# Patient Record
Sex: Female | Born: 2000 | Race: Asian | Hispanic: No | Smoking: Former smoker
Health system: Southern US, Community
[De-identification: ages and names within clinical notes are randomized; demographics above are authoritative.]

## PROBLEM LIST (undated history)

## (undated) DIAGNOSIS — L309 Dermatitis, unspecified: Secondary | ICD-10-CM

## (undated) DIAGNOSIS — J45909 Unspecified asthma, uncomplicated: Secondary | ICD-10-CM

## (undated) HISTORY — DX: Dermatitis, unspecified: L30.9

## (undated) HISTORY — PX: WISDOM TOOTH EXTRACTION: SHX21

---
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
## 2022-04-20 ENCOUNTER — Other Ambulatory Visit: Payer: Self-pay

## 2022-04-20 ENCOUNTER — Emergency Department
Admission: EM | Admit: 2022-04-20 | Discharge: 2022-04-20 | Disposition: A | Discharge status: Home / Self Care | Payer: Medicaid Other | Attending: Emergency Medicine | Admitting: Emergency Medicine

## 2022-04-20 DIAGNOSIS — N3090 Cystitis, unspecified without hematuria: Secondary | ICD-10-CM | POA: Insufficient documentation

## 2022-04-20 DIAGNOSIS — J45909 Unspecified asthma, uncomplicated: Secondary | ICD-10-CM | POA: Diagnosis not present

## 2022-04-20 DIAGNOSIS — D72829 Elevated white blood cell count, unspecified: Secondary | ICD-10-CM | POA: Diagnosis not present

## 2022-04-20 DIAGNOSIS — R350 Frequency of micturition: Secondary | ICD-10-CM | POA: Diagnosis present

## 2022-04-20 LAB — URINALYSIS, ROUTINE W REFLEX MICROSCOPIC
Bilirubin Urine: NEGATIVE
Glucose, UA: NEGATIVE mg/dL
Ketones, ur: NEGATIVE mg/dL
Nitrite: NEGATIVE
Protein, ur: 100 mg/dL — AB
RBC / HPF: >50 RBC/hpf — ABNORMAL HIGH (ref 0–5)
Specific Gravity, Urine: 1.024 (ref 1.005–1.030)
Squamous Epithelial / HPF: NONE SEEN (ref 0–5)
WBC, UA: >50 WBC/hpf — ABNORMAL HIGH (ref 0–5)
pH: 7 (ref 5.0–8.0)

## 2022-04-20 LAB — POC URINE PREG, ED: Preg Test, Ur: NEGATIVE

## 2022-04-20 MED ORDER — CEPHALEXIN 500 MG PO CAPS: 500.0000 mg | ORAL_CAPSULE | Freq: Two times a day (BID) | ORAL | 0 refills | Status: AC

## 2022-04-20 NOTE — ED Provider Notes (Signed)
[facility] Provider Note    Event Date/Time   First MD Initiated Contact with Patient 04/20/22 1738     (approximate)   History   Chief Complaint Urinary Frequency   HPI Priscilla Cummings is a 21 y.o. female, history of asthma, eczema, presents to the emergency department for evaluation of dysuria.  Reports urinary frequency and dysuria for the past 2 days.  No known fevers at home.  She has been taking boric acid and cranberry supplements with little to no relief.  She states that she has had urinary tract infections in the past and this feels the same.  She is sexually active, states that she is not concerned for any STDs at this time.  Denies fever/chills, vaginal bleeding/discharge, abdominal pain, pelvic pain, diarrhea, nausea/vomiting, rash/lesions, or numbness/tingling upper or lower extremities.  History Limitations: No limitations.        Physical Exam  Triage Vital Signs: ED Triage Vitals  Enc Vitals Group     BP 04/20/22 1726 107/84     Pulse Rate 04/20/22 1726 (!) 109     Resp 04/20/22 1726 17     Temp 04/20/22 1726 98.7 F (37.1 C)     Temp Source 04/20/22 1726 Oral     SpO2 04/20/22 1726 96 %     Weight --      Height 04/20/22 1727 5\' 3"  (1.6 m)     Head Circumference --      Peak Flow --      Pain Score 04/20/22 1727 7     Pain Loc --      Pain Edu? --      Excl. in GC? --     Most recent vital signs: Vitals:   04/20/22 1726  BP: 107/84  Pulse: (!) 109  Resp: 17  Temp: 98.7 F (37.1 C)  SpO2: 96%    General: Awake, NAD.  Skin: Warm, dry. No rashes or lesions.  Eyes: PERRL. Conjunctivae normal.  CV: Good peripheral perfusion.  Resp: Normal effort.  Abd: Soft, non-tender. No distention.  Neuro: At baseline. No gross neurological deficits.   Focused Exam: No CVA tenderness.  Physical Exam    ED Results / Procedures / Treatments  Labs (all labs ordered are listed, but only abnormal results are displayed) Labs  Reviewed  URINALYSIS, ROUTINE W REFLEX MICROSCOPIC - Abnormal; Notable for the following components:      Result Value   Color, Urine YELLOW (*)    APPearance CLOUDY (*)    Hgb urine dipstick LARGE (*)    Protein, ur 100 (*)    Leukocytes,Ua LARGE (*)    RBC / HPF >50 (*)    WBC, UA >50 (*)    Bacteria, UA RARE (*)    Non Squamous Epithelial PRESENT (*)    All other components within normal limits  POC URINE PREG, ED     EKG N/A.   RADIOLOGY  ED Provider Interpretation: N/A.  No results found.  PROCEDURES:  Critical Care performed: N/A  Procedures    MEDICATIONS ORDERED IN ED: Medications - No data to display   IMPRESSION / MDM / ASSESSMENT AND PLAN / ED COURSE  I reviewed the triage vital signs and the nursing notes.                              Differential diagnosis includes, but is not limited to, cystitis, pyelonephritis,  urethritis.  ED Course Patient appears well, NAD.  Pregnancy test negative.  Urinalysis shows large leukocytes, greater than 50 WBC, rare bacteria.  In the presence of dysuria, highly suggestive of urinary tract infection.  Assessment/Plan Presentation consistent with cystitis.  Supported by urinalysis.  Physical exam unremarkable.  No CVA tenderness.  Very unlikely pyelonephritis or nephrolithiasis given her presentation.  We will provide her with a prescription for cephalexin.  No further work-up or treatment indicated at this time.  We will plan to discharge.  Provided the patient with anticipatory guidance, return precautions, and educational material. Encouraged the patient to return to the emergency department at any time if they begin to experience any new or worsening symptoms. Patient expressed understanding and agreed with the plan.       FINAL CLINICAL IMPRESSION(S) / ED DIAGNOSES   Final diagnoses:  Cystitis     Rx / DC Orders   ED Discharge Orders          Ordered    cephALEXin (KEFLEX) 500 MG capsule  2  times daily        04/20/22 1833             Note:  This document was prepared using Dragon voice recognition software and may include unintentional dictation errors.   Varney Daily, [place] 04/20/22 Ovidio Kin    Chesley Noon, MD 04/20/22 [phone]

## 2022-04-20 NOTE — ED Triage Notes (Signed)
Patient to ER via POV with reports of dysuria/ urinary frequency x2 days. No known fevers at home. Has been taking boric acid/ cranberry supplements with no relief in symptoms. Denies vaginal discharge.

## 2022-04-20 NOTE — ED Notes (Signed)
See triage note  presents with some urinary freq and pain  sxs' started 2 days ago  denies any n/v/d or fever

## 2022-04-20 NOTE — Discharge Instructions (Addendum)
-  Take all of your antibiotics as prescribed.  -Follow-up with your primary care provider as needed.  -Return to the emergency department anytime if you begin to experience any new or worsening symptoms. 

## 2022-05-04 IMAGING — DX DG ANKLE COMPLETE 3+V*L*
3 series · 3 of 3 positions shown · non-contrast
Comparison: None.

CLINICAL DATA: Left ankle pain

EXAM:
LEFT ANKLE COMPLETE - 3+ VIEW

[ankle ap]
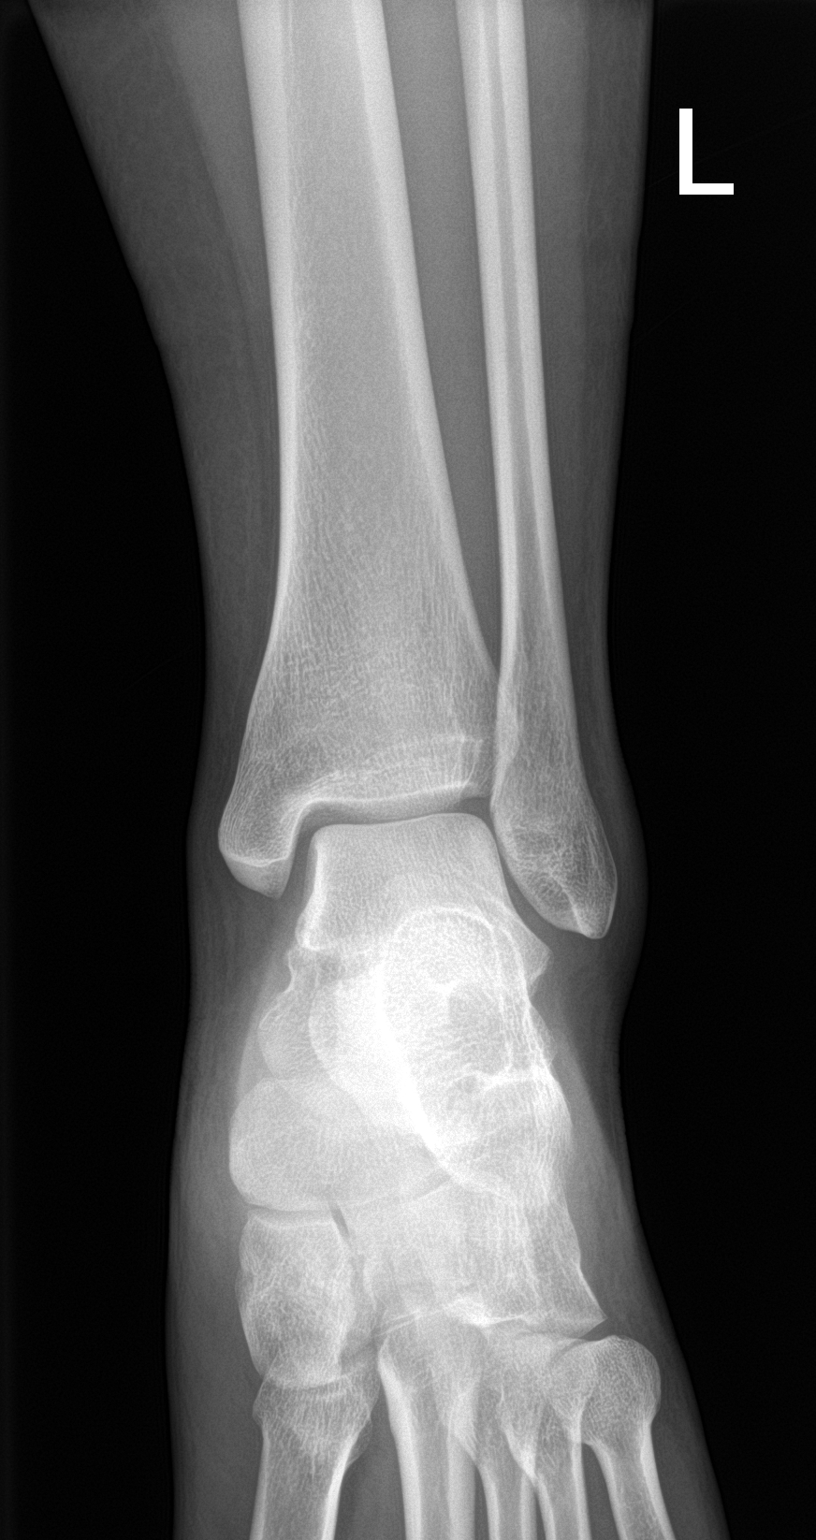

[ankle obl]
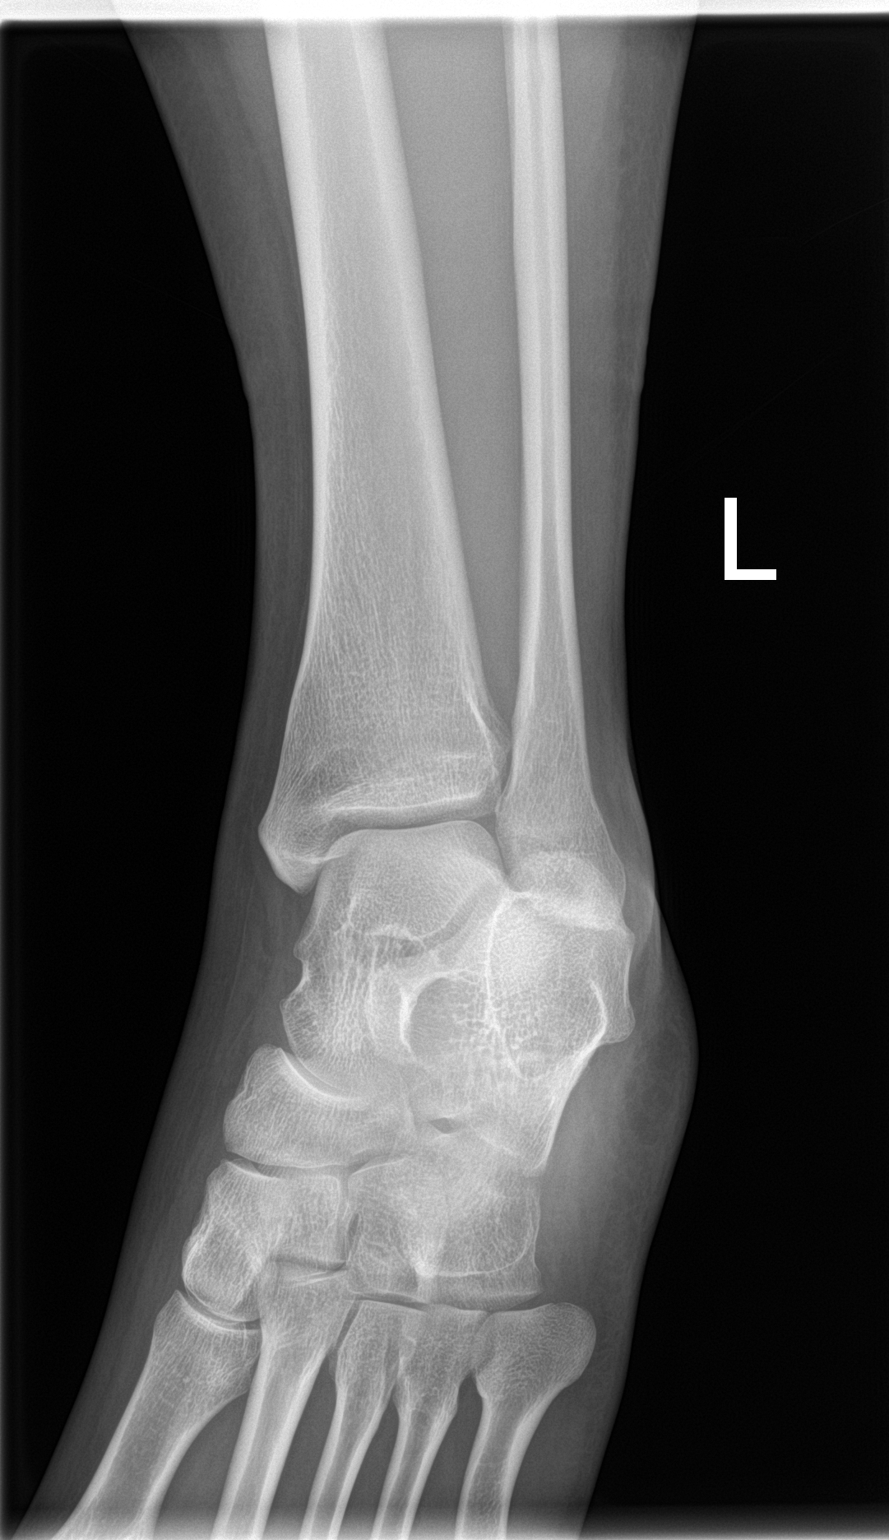

[ankle lat]
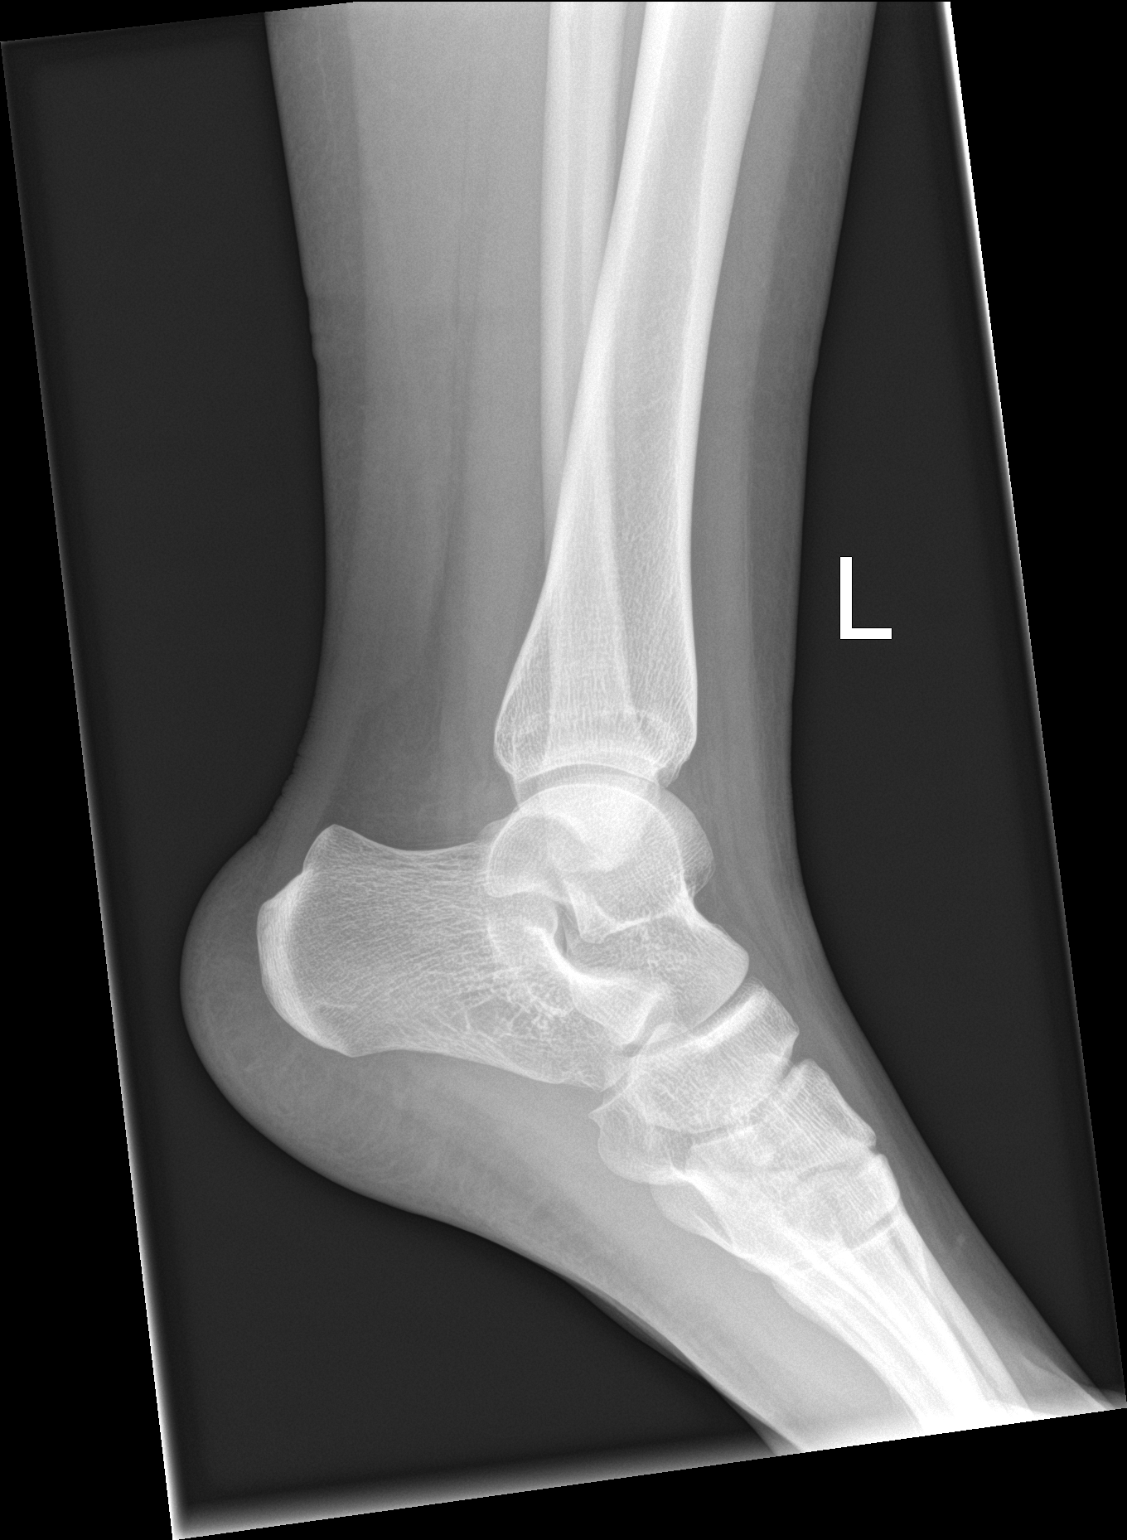

[3 of 3 positions shown; findings below may reference images not displayed]

FINDINGS: There is no evidence of fracture, dislocation, or joint effusion.
There is no evidence of arthropathy or other focal bone abnormality.
Soft tissues are unremarkable.
IMPRESSION: Negative.

## 2022-05-16 ENCOUNTER — Other Ambulatory Visit: Payer: Self-pay

## 2022-05-16 ENCOUNTER — Emergency Department
Admission: EM | Admit: 2022-05-16 | Discharge: 2022-05-16 | Disposition: A | Discharge status: Home / Self Care | Payer: Medicaid Other | Attending: Physician Assistant | Admitting: Physician Assistant

## 2022-05-16 ENCOUNTER — Emergency Department: Payer: Medicaid Other

## 2022-05-16 ENCOUNTER — Encounter: Payer: Self-pay | Admitting: Emergency Medicine

## 2022-05-16 DIAGNOSIS — J45909 Unspecified asthma, uncomplicated: Secondary | ICD-10-CM | POA: Diagnosis not present

## 2022-05-16 DIAGNOSIS — R059 Cough, unspecified: Secondary | ICD-10-CM | POA: Diagnosis present

## 2022-05-16 DIAGNOSIS — R051 Acute cough: Secondary | ICD-10-CM | POA: Insufficient documentation

## 2022-05-16 MED ORDER — PREDNISONE 10 MG (21) PO TBPK: ORAL_TABLET | ORAL | 0 refills | Status: DC

## 2022-05-16 MED ORDER — IPRATROPIUM-ALBUTEROL 0.5-2.5 (3) MG/3ML IN SOLN
3.0000 mL | Freq: Once | RESPIRATORY_TRACT | Status: AC
  Administered 2022-05-16: 3 mL via RESPIRATORY_TRACT
  Filled 2022-05-16: qty 3

## 2022-05-16 MED ORDER — BENZONATATE 100 MG PO CAPS: 100.0000 mg | ORAL_CAPSULE | Freq: Two times a day (BID) | ORAL | 0 refills | Status: AC | PRN

## 2022-05-16 NOTE — ED Provider Notes (Signed)
[facility] Provider Note  Patient Contact: 5:38 PM (approximate)   History   Cough   HPI  Priscilla Cummings is a 21 y.o. female history of asthma, presents to the emergency department with wheezing and cough for approximately 1 week.  Patient reports that she feels as though she is having an asthma exacerbation.  She states that she has been using her albuterol inhalers with little relief.  She describes a prodrome of viral URI-like symptoms before onset of cough.  No vomiting, diarrhea or recent fever.      Physical Exam   Triage Vital Signs: ED Triage Vitals  Enc Vitals Group     BP 05/16/22 1621 (!) 127/91     Pulse Rate 05/16/22 1621 66     Resp 05/16/22 1621 16     Temp 05/16/22 1621 98.8 F (37.1 C)     Temp Source 05/16/22 1621 Oral     SpO2 05/16/22 1621 99 %     Weight 05/16/22 1620 125 lb 10.6 oz (57 kg)     Height 05/16/22 1620 5\' 3"  (1.6 m)     Head Circumference --      Peak Flow --      Pain Score 05/16/22 1621 0     Pain Loc --      Pain Edu? --      Excl. in GC? --     Most recent vital signs: Vitals:   05/16/22 1621  BP: (!) 127/91  Pulse: 66  Resp: 16  Temp: 98.8 F (37.1 C)  SpO2: 99%     Constitutional: Alert and oriented. Patient is lying supine. Eyes: Conjunctivae are normal. PERRL. EOMI. Head: Atraumatic. ENT:      Ears: Tympanic membranes are mildly injected with mild effusion bilaterally.       Nose: No congestion/rhinnorhea.      Mouth/Throat: Mucous membranes are moist. Posterior pharynx is mildly erythematous.  Hematological/Lymphatic/Immunilogical: No cervical lymphadenopathy.  Cardiovascular: Normal rate, regular rhythm. Normal S1 and S2.  Good peripheral circulation. Respiratory: Normal respiratory effort without tachypnea or retractions.  Patient had wheezing auscultated bilaterally.  Good air entry to the bases with no decreased or absent breath sounds. Gastrointestinal: Bowel sounds 4 quadrants.  Soft and nontender to palpation. No guarding or rigidity. No palpable masses. No distention. No CVA tenderness. Musculoskeletal: Full range of motion to all extremities. No gross deformities appreciated. Neurologic:  Normal speech and language. No gross focal neurologic deficits are appreciated.  Skin:  Skin is warm, dry and intact. No rash noted. Psychiatric: Mood and affect are normal. Speech and behavior are normal. Patient exhibits appropriate insight and judgement.   ED Results / Procedures / Treatments   Labs (all labs ordered are listed, but only abnormal results are displayed) Labs Reviewed - No data to display    RADIOLOGY  I personally viewed and evaluated these images as part of my medical decision making, as well as reviewing the written report by the radiologist.  ED Provider Interpretation: No consolidations, opacities or infiltrates to suggest pneumonia.   PROCEDURES:  Critical Care performed: No  Procedures   MEDICATIONS ORDERED IN ED: Medications  ipratropium-albuterol (DUONEB) 0.5-2.5 (3) MG/3ML nebulizer solution 3 mL (3 mLs Nebulization Given 05/16/22 1745)     IMPRESSION / MDM / ASSESSMENT AND PLAN / ED COURSE  I reviewed the triage vital signs and the nursing notes.  Assessment and plan:  Cough:  20 year old female presents to the emergency department with cough for 1 week.  Vital signs were reassuring at triage.  On exam, patient was alert, active and nontoxic-appearing.  Patient was given DuoNeb.   Wheezing improved after DuoNeb.  Patient was discharged with tapered prednisone and Tessalon Perles.  Return precautions were given to return with new or worsening symptoms.  FINAL CLINICAL IMPRESSION(S) / ED DIAGNOSES   Final diagnoses:  Acute cough     Rx / DC Orders   ED Discharge Orders          Ordered    predniSONE (STERAPRED UNI-PAK 21 TAB) 10 MG (21) TBPK tablet        05/16/22 1805    benzonatate  (TESSALON PERLES) 100 MG capsule  2 times daily PRN        05/16/22 1805             Note:  This document was prepared using Dragon voice recognition software and may include unintentional dictation errors.   Pia Mau [place], PA-C 05/16/22 1810    Minna Antis, MD 05/16/22 [phone]

## 2022-05-16 NOTE — ED Provider Triage Note (Signed)
Emergency Medicine Provider Triage Evaluation Note  Priscilla Cummings , a 21 y.o. female  was evaluated in triage.  Pt complains of cough and congestion for 1 to 2 weeks.  States it started 2 days after she started female school.  Had fever and chills for stable.  None since then.  States mucus is stuck in her throat.  Having trouble breathing and inhaler is not helping..  Review of Systems  Positive: See above Negative: Chest pain  Physical Exam  BP (!) 127/91 (BP Location: Left Arm)   Pulse 66   Temp 98.8 F (37.1 C) (Oral)   Resp 16   Ht 5\' 3"  (1.6 m)   Wt 57 kg   SpO2 99%   BMI 22.26 kg/m  Gen:   Awake, no distress   Resp:  Normal effort  MSK:   Moves extremities without difficulty  Other:    Medical Decision Making  Medically screening exam initiated at 5:00 PM.  Appropriate orders placed.  JADWIGA FAIDLEY was informed that the remainder of the evaluation will be completed by another provider, this initial triage assessment does not replace that evaluation, and the importance of remaining in the ED until their evaluation is complete.  Patient appears well, lungs clear to auscultation.  Will order chest x-ray and then discharge   Ginger Organ, PA-C 05/16/22 1700

## 2022-05-16 NOTE — Discharge Instructions (Addendum)
Take tapered prednisone as directed. Take Tessalon Perles at night before bed.

## 2022-05-16 NOTE — ED Triage Notes (Signed)
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 Since being there, she has developed a cough. No relief with asthma inhaler or OTC meds.

## 2023-05-22 ENCOUNTER — Other Ambulatory Visit: Payer: Self-pay

## 2023-05-22 ENCOUNTER — Emergency Department
Admission: EM | Admit: 2023-05-22 | Discharge: 2023-05-22 | Disposition: A | Discharge status: Home / Self Care | Payer: Medicaid Other | Attending: Emergency Medicine | Admitting: Emergency Medicine

## 2023-05-22 DIAGNOSIS — R350 Frequency of micturition: Secondary | ICD-10-CM | POA: Insufficient documentation

## 2023-05-22 DIAGNOSIS — R300 Dysuria: Secondary | ICD-10-CM | POA: Diagnosis present

## 2023-05-22 DIAGNOSIS — N3000 Acute cystitis without hematuria: Secondary | ICD-10-CM

## 2023-05-22 LAB — URINALYSIS, ROUTINE W REFLEX MICROSCOPIC
Bilirubin Urine: NEGATIVE
Glucose, UA: NEGATIVE mg/dL
Ketones, ur: NEGATIVE mg/dL
Nitrite: NEGATIVE
Protein, ur: NEGATIVE mg/dL
Specific Gravity, Urine: 1.016 (ref 1.005–1.030)
WBC, UA: >50 WBC/hpf (ref 0–5)
pH: 5 (ref 5.0–8.0)

## 2023-05-22 MED ORDER — CEPHALEXIN 500 MG PO CAPS: 500.0000 mg | ORAL_CAPSULE | Freq: Four times a day (QID) | ORAL | 0 refills | Status: AC

## 2023-05-22 MED ORDER — CEPHALEXIN 500 MG PO CAPS
500.0000 mg | ORAL_CAPSULE | Freq: Once | ORAL | Status: AC
  Administered 2023-05-22: 500 mg via ORAL
  Filled 2023-05-22: qty 1

## 2023-05-22 NOTE — ED Notes (Signed)
See triage notes. Patient has a history of UTIs. C/O increased frequency and dysuria

## 2023-05-22 NOTE — ED Provider Notes (Signed)
[facility] Provider Note  Patient Contact: 4:56 PM (approximate)   History   UTI   HPI  RINOA GARRAMONE is a 22 y.o. female presents to the emergency department with dysuria and increased urinary frequency for the past 2 to 3 days.  Patient denies fever and chills.  She states that she has had some nausea but no vomiting.  No flank pain.  She denies possibility of pregnancy.  No chest pain, chest tightness or abdominal pain.  No concern for STDs or vaginal rash.      Physical Exam   Triage Vital Signs: ED Triage Vitals [05/22/23 1557]  Enc Vitals Group     BP 117/81     Pulse Rate 67     Resp 18     Temp 98.1 F (36.7 C)     Temp Source Oral     SpO2 100 %     Weight      Height      Head Circumference      Peak Flow      Pain Score      Pain Loc      Pain Edu?      Excl. in GC?     Most recent vital signs: Vitals:   05/22/23 1557  BP: 117/81  Pulse: 67  Resp: 18  Temp: 98.1 F (36.7 C)  SpO2: 100%     General: Alert and in no acute distress. Eyes:  PERRL. EOMI. Head: No acute traumatic findings ENT:      Nose: No congestion/rhinnorhea.      Mouth/Throat: Mucous membranes are moist. Neck: No stridor. No cervical spine tenderness to palpation. Cardiovascular:  Good peripheral perfusion Respiratory: Normal respiratory effort without tachypnea or retractions. Lungs CTAB. Good air entry to the bases with no decreased or absent breath sounds. Gastrointestinal: Bowel sounds 4 quadrants. Soft and nontender to palpation. No guarding or rigidity. No palpable masses. No distention. No CVA tenderness. Musculoskeletal: Full range of motion to all extremities.  Neurologic:  No gross focal neurologic deficits are appreciated.  Skin:   No rash noted    ED Results / Procedures / Treatments   Labs (all labs ordered are listed, but only abnormal results are displayed) Labs Reviewed  URINALYSIS, ROUTINE W REFLEX MICROSCOPIC - Abnormal;  Notable for the following components:      Result Value   Color, Urine YELLOW (*)    APPearance HAZY (*)    Hgb urine dipstick MODERATE (*)    Leukocytes,Ua LARGE (*)    Bacteria, UA RARE (*)    All other components within normal limits  URINE CULTURE       PROCEDURES:  Critical Care performed: No  Procedures   MEDICATIONS ORDERED IN ED: Medications  cephALEXin (KEFLEX) capsule 500 mg (500 mg Oral Given 05/22/23 1658)     IMPRESSION / MDM / ASSESSMENT AND PLAN / ED COURSE  I reviewed the triage vital signs and the nursing notes.                              Assessment and plan UTI 22 year old female presents to the emergency department with dysuria and increased urinary frequency for the past 2 to 3 days without flank pain, fever or vomiting to suggest pyelonephritis or nephrolithiasis.  Urine culture is in process and will treat with Keflex 4 times daily for the next 7 days.  Patient's first  dose of Keflex was given while in the emergency department.      FINAL CLINICAL IMPRESSION(S) / ED DIAGNOSES   Final diagnoses:  Dysuria     Rx / DC Orders   ED Discharge Orders     None        Note:  This document was prepared using Dragon voice recognition software and may include unintentional dictation errors.   Pia Mau [place], PA-C 05/22/23 1801    Chesley Noon, MD 05/22/23 [phone]

## 2023-05-22 NOTE — Discharge Instructions (Signed)
Take Keflex four times daily for the next seven days.  

## 2023-05-22 NOTE — ED Triage Notes (Signed)
Pt presents to ED with c/o of UTI, pt states HX of same. Pt states increased urine frequency and dysuria. NAD noted.

## 2023-05-25 LAB — URINE CULTURE: Culture: >=100000 — AB

## 2023-06-14 ENCOUNTER — Ambulatory Visit: Payer: MEDICAID

## 2023-07-20 ENCOUNTER — Encounter: Payer: Self-pay | Admitting: Emergency Medicine

## 2023-07-20 ENCOUNTER — Emergency Department
Admission: EM | Admit: 2023-07-20 | Discharge: 2023-07-20 | Disposition: A | Discharge status: Home / Self Care | Payer: MEDICAID | Attending: Emergency Medicine | Admitting: Emergency Medicine

## 2023-07-20 ENCOUNTER — Other Ambulatory Visit: Payer: Self-pay

## 2023-07-20 DIAGNOSIS — J45909 Unspecified asthma, uncomplicated: Secondary | ICD-10-CM | POA: Insufficient documentation

## 2023-07-20 DIAGNOSIS — R300 Dysuria: Secondary | ICD-10-CM | POA: Diagnosis present

## 2023-07-20 DIAGNOSIS — N390 Urinary tract infection, site not specified: Secondary | ICD-10-CM | POA: Diagnosis not present

## 2023-07-20 DIAGNOSIS — D72829 Elevated white blood cell count, unspecified: Secondary | ICD-10-CM | POA: Insufficient documentation

## 2023-07-20 LAB — URINALYSIS, ROUTINE W REFLEX MICROSCOPIC
Bilirubin Urine: NEGATIVE
Glucose, UA: NEGATIVE mg/dL
Ketones, ur: 5 mg/dL — AB
Nitrite: NEGATIVE
Protein, ur: >=300 mg/dL — AB
RBC / HPF: >50 RBC/hpf (ref 0–5)
Specific Gravity, Urine: 1.026 (ref 1.005–1.030)
WBC, UA: >50 WBC/hpf (ref 0–5)
pH: 6 (ref 5.0–8.0)

## 2023-07-20 LAB — PREGNANCY, URINE: Preg Test, Ur: NEGATIVE

## 2023-07-20 MED ORDER — CEPHALEXIN 500 MG PO CAPS: 500.0000 mg | ORAL_CAPSULE | Freq: Three times a day (TID) | ORAL | 0 refills | Status: AC

## 2023-07-20 MED ORDER — PHENAZOPYRIDINE HCL 100 MG PO TABS: 100.0000 mg | ORAL_TABLET | Freq: Three times a day (TID) | ORAL | 0 refills | Status: AC | PRN

## 2023-07-20 NOTE — ED Triage Notes (Signed)
Pt to ED via POV. Pt states that she had been having pain with urination for the past few days. Pt states that she has also had the urge to use the restroom a lot. Pt states that she is also having irritation. Pt is currently in NAD.

## 2023-07-20 NOTE — ED Provider Notes (Signed)
[facility] Provider Note    Event Date/Time   First MD Initiated Contact with Patient 07/20/23 [phone]     (approximate)   History   Dysuria   HPI  Priscilla Cummings is a 22 y.o. female with history of asthma that presents emergency department with dysuria.  History of similar symptoms in the past.  No vaginal discharge.  No concerns for STD.  No fever or chills.  No vomiting.      Physical Exam   Triage Vital Signs: ED Triage Vitals [07/20/23 0900]  Encounter Vitals Group     BP 124/87     Systolic BP Percentile      Diastolic BP Percentile      Pulse Rate (!) 116     Resp 16     Temp 97.8 F (36.6 C)     Temp Source Oral     SpO2 99 %     Weight 119 lb (54 kg)     Height 5\' 3"  (1.6 m)     Head Circumference      Peak Flow      Pain Score 0     Pain Loc      Pain Education      Exclude from Growth Chart     Most recent vital signs: Vitals:   07/20/23 0900  BP: 124/87  Pulse: (!) 116  Resp: 16  Temp: 97.8 F (36.6 C)  SpO2: 99%     General: Awake, no distress.   CV:  Good peripheral perfusion. regular rate and  rhythm Resp:  Normal effort.  Abd:  No distention.  Nontender, no CVA tenderness Other:     ED Results / Procedures / Treatments   Labs (all labs ordered are listed, but only abnormal results are displayed) Labs Reviewed  URINALYSIS, ROUTINE W REFLEX MICROSCOPIC - Abnormal; Notable for the following components:      Result Value   Color, Urine YELLOW (*)    APPearance CLOUDY (*)    Hgb urine dipstick SMALL (*)    Ketones, ur 5 (*)    Protein, ur >=300 (*)    Leukocytes,Ua LARGE (*)    Bacteria, UA RARE (*)    All other components within normal limits  PREGNANCY, URINE     EKG     RADIOLOGY     PROCEDURES:   Procedures   MEDICATIONS ORDERED IN ED: Medications - No data to display   IMPRESSION / MDM / ASSESSMENT AND PLAN / ED COURSE  I reviewed the triage vital signs and the nursing  notes.                              Differential diagnosis includes, but is not limited to, UTI, pyelonephritis, cystitis, STD  Patient's presentation is most consistent with acute complicated illness / injury requiring diagnostic workup.   UA and urine pregnancy ordered  Feel that STD is less likely as the patient has noes concerns due to being with the same partner for 3 years.  Also do not feel this is pyelonephritis secondary to afebrile, no vomiting etc.  UA has large amount of leuks, greater than 300 protein, rare bacteria white blood cell clumps noted  Urine pregnancy is negative  Will treat the patient with antibiotic.  Keflex 500mg , pyridium.  Patient is in agreement treatment plan.  Discharged stable condition.  Strict instructions to return for worsening.  FINAL CLINICAL IMPRESSION(S) / ED DIAGNOSES   Final diagnoses:  Dysuria  Acute UTI     Rx / DC Orders   ED Discharge Orders          Ordered    cephALEXin (KEFLEX) 500 MG capsule  3 times daily        07/20/23 0949    phenazopyridine (PYRIDIUM) 100 MG tablet  3 times daily PRN        07/20/23 0949             Note:  This document was prepared using Dragon voice recognition software and may include unintentional dictation errors.    Faythe Ghee, PA-C 07/20/23 8657    Chesley Noon, MD 07/20/23 [phone]

## 2023-08-05 ENCOUNTER — Emergency Department
Admission: EM | Admit: 2023-08-05 | Discharge: 2023-08-05 | Disposition: A | Discharge status: Home / Self Care | Payer: MEDICAID | Attending: Emergency Medicine | Admitting: Emergency Medicine

## 2023-08-05 ENCOUNTER — Other Ambulatory Visit: Payer: Self-pay

## 2023-08-05 ENCOUNTER — Encounter: Payer: Self-pay | Admitting: Emergency Medicine

## 2023-08-05 DIAGNOSIS — N760 Acute vaginitis: Secondary | ICD-10-CM | POA: Diagnosis not present

## 2023-08-05 DIAGNOSIS — N898 Other specified noninflammatory disorders of vagina: Secondary | ICD-10-CM | POA: Diagnosis present

## 2023-08-05 DIAGNOSIS — J45909 Unspecified asthma, uncomplicated: Secondary | ICD-10-CM | POA: Insufficient documentation

## 2023-08-05 DIAGNOSIS — B9689 Other specified bacterial agents as the cause of diseases classified elsewhere: Secondary | ICD-10-CM

## 2023-08-05 LAB — WET PREP, GENITAL
Sperm: NONE SEEN
Trich, Wet Prep: NONE SEEN
WBC, Wet Prep HPF POC: <10 (ref <10)
Yeast Wet Prep HPF POC: NONE SEEN

## 2023-08-05 LAB — URINALYSIS, ROUTINE W REFLEX MICROSCOPIC
Bilirubin Urine: NEGATIVE
Glucose, UA: NEGATIVE mg/dL
Ketones, ur: NEGATIVE mg/dL
Nitrite: NEGATIVE
Protein, ur: NEGATIVE mg/dL
RBC / HPF: >50 RBC/hpf (ref 0–5)
Specific Gravity, Urine: 1.011 (ref 1.005–1.030)
Squamous Epithelial / HPF: NONE SEEN /HPF (ref 0–5)
WBC, UA: >50 WBC/hpf (ref 0–5)
pH: 7 (ref 5.0–8.0)

## 2023-08-05 LAB — CHLAMYDIA/NGC RT PCR (ARMC ONLY)
Chlamydia Tr: NOT DETECTED
N gonorrhoeae: NOT DETECTED

## 2023-08-05 LAB — POC URINE PREG, ED: Preg Test, Ur: NEGATIVE

## 2023-08-05 LAB — HIV ANTIBODY (ROUTINE TESTING W REFLEX): HIV Screen 4th Generation wRfx: NONREACTIVE

## 2023-08-05 MED ORDER — METRONIDAZOLE 500 MG PO TABS
500.0000 mg | ORAL_TABLET | Freq: Two times a day (BID) | ORAL | 0 refills | Status: AC
Start: 2023-08-05 — End: 2023-08-12

## 2023-08-05 NOTE — Discharge Instructions (Addendum)
Your test is positive for Bacterial Vaginosis. Take the medications as prescribed. Gonorrhea and Chlamydia were negative. Return for new, worsening, or change in symptoms or other concerns.

## 2023-08-05 NOTE — ED Triage Notes (Addendum)
Pt via POV from home. Pt states that she had "too much fun" with her boyfriend when they had sexual intercourse on Wednesday. Since then she has had urinary frequency, pelvic pressure, pinkish vaginal discharge, and foul odor. She also wanted to be tested for STDs. Pt reports that she was here 2 weeks ago for same but complete abx course. Pt is A&Ox4 and NAD

## 2023-08-05 NOTE — ED Provider Notes (Signed)
[facility] Provider Note    Event Date/Time   First MD Initiated Contact with Patient 08/05/23 1112     (approximate)   History   Urinary Tract Infection   HPI  Priscilla Cummings is a 22 y.o. female who presents today for evaluation of burning with urination, and vaginal discharge that has been ongoing for the past 2 days.  She reports that she has been sexually active with her boyfriend who she has since learned has been unfaithful and has had intercourse with other women.  She reports that she has a faint smell to her vaginal discharge.  No abdominal pain.  No dyspareunia.  No fevers or chills.  Patient Active Problem List   Diagnosis Date Noted   Pyelonephritis dx'd 12/14/21 ER 12/23/2021   Anaphylactic shock 05/31/2021   Allergic reaction    Asthma 10/29/2020   AR (allergic rhinitis) 10/07/2012          Physical Exam   Triage Vital Signs: ED Triage Vitals  Encounter Vitals Group     BP 08/05/23 1053 111/88     Systolic BP Percentile --      Diastolic BP Percentile --      Pulse Rate 08/05/23 1053 (!) 102     Resp 08/05/23 1053 20     Temp 08/05/23 1053 98 F (36.7 C)     Temp src --      SpO2 08/05/23 1053 100 %     Weight 08/05/23 1055 120 lb (54.4 kg)     Height 08/05/23 1055 5\' 3"  (1.6 m)     Head Circumference --      Peak Flow --      Pain Score --      Pain Loc --      Pain Education --      Exclude from Growth Chart --     Most recent vital signs: Vitals:   08/05/23 1053  BP: 111/88  Pulse: (!) 102  Resp: 20  Temp: 98 F (36.7 C)  SpO2: 100%    Physical Exam Vitals and nursing note reviewed.  Constitutional:      General: Awake and alert. No acute distress.    Appearance: Normal appearance. The patient is normal weight.  HENT:     Head: Normocephalic and atraumatic.     Mouth: Mucous membranes are moist.  Eyes:     General: PERRL. Normal EOMs        Right eye: No discharge.        Left eye: No discharge.      Conjunctiva/sclera: Conjunctivae normal.  Cardiovascular:     Rate and Rhythm: Normal rate and regular rhythm.     Pulses: Normal pulses.  Pulmonary:     Effort: Pulmonary effort is normal. No respiratory distress.     Breath sounds: Normal breath sounds.  Abdominal:     Abdomen is soft. There is no abdominal tenderness. No rebound or guarding. No distention. No CVAT Musculoskeletal:        General: No swelling. Normal range of motion.     Cervical back: Normal range of motion and neck supple.  Skin:    General: Skin is warm and dry.     Capillary Refill: Capillary refill takes less than 2 seconds.     Findings: No rash.  Neurological:     Mental Status: The patient is awake and alert.      ED Results / Procedures / Treatments  Labs (all labs ordered are listed, but only abnormal results are displayed) Labs Reviewed  WET PREP, GENITAL - Abnormal; Notable for the following components:      Result Value   Clue Cells Wet Prep HPF POC PRESENT (*)    All other components within normal limits  URINALYSIS, ROUTINE W REFLEX MICROSCOPIC - Abnormal; Notable for the following components:   Color, Urine YELLOW (*)    APPearance CLOUDY (*)    Hgb urine dipstick MODERATE (*)    Leukocytes,Ua LARGE (*)    Bacteria, UA RARE (*)    All other components within normal limits  CHLAMYDIA/NGC RT PCR ([facility] ONLY)            URINE CULTURE  RPR  HIV ANTIBODY (ROUTINE TESTING W REFLEX)  POC URINE PREG, ED     EKG     RADIOLOGY     PROCEDURES:  Critical Care performed:   Procedures   MEDICATIONS ORDERED IN ED: Medications - No data to display   IMPRESSION / MDM / ASSESSMENT AND PLAN / ED COURSE  I reviewed the triage vital signs and the nursing notes.   Differential diagnosis includes, but is not limited to, sexually transmitted infection, bacterial vaginosis, UTI.  Patient is awake and alert, hemodynamically stable and afebrile.  She is nontoxic in appearance.  She has  no abdominal tenderness or flank pain/CVA tenderness.  No fevers or chills.  I do not suspect pyelonephritis, TOA, or PID at this time.  She has not had any dyspareunia.  Patient opted to self swab, declined pelvic exam.  Her first swab reveals clue cells consistent with bacterial vaginosis.  Urinalysis also demonstrates RBCs and WBCs, but rare bacteria, sent for culture as she reports that she has been treated for UTIs twice in the past 3 weeks. Started on flagyl.  Patient was advised that there are many other STDs that patient could have, that we do not test for in the emergency department. Patient was advised to follow up with a primary care doctor to have the full panel of testing performed. Patient was advised to not have sexual intercourse until fully and properly tested and treated. Patient was advised that his partner also needs to be tested and treated. Patient understands and agrees with plan.   Patient's presentation is most consistent with acute complicated illness / injury requiring diagnostic workup.    FINAL CLINICAL IMPRESSION(S) / ED DIAGNOSES   Final diagnoses:  BV (bacterial vaginosis)     Rx / DC Orders   ED Discharge Orders          Ordered    metroNIDAZOLE (FLAGYL) 500 MG tablet  2 times daily        08/05/23 1404             Note:  This document was prepared using Dragon voice recognition software and may include unintentional dictation errors.   Jackelyn Hoehn, PA-C 08/05/23 1416    Janith Lima, MD 08/05/23 [phone]

## 2023-08-06 LAB — RPR: RPR Ser Ql: NONREACTIVE

## 2023-08-06 NOTE — Group Note (Deleted)

## 2023-08-08 LAB — URINE CULTURE: Culture: 40000 — AB

## 2023-09-03 ENCOUNTER — Emergency Department
Admission: EM | Admit: 2023-09-03 | Discharge: 2023-09-03 | Disposition: A | Discharge status: Home / Self Care | Payer: MEDICAID | Attending: Emergency Medicine | Admitting: Emergency Medicine

## 2023-09-03 ENCOUNTER — Other Ambulatory Visit: Payer: Self-pay

## 2023-09-03 DIAGNOSIS — B9689 Other specified bacterial agents as the cause of diseases classified elsewhere: Secondary | ICD-10-CM

## 2023-09-03 DIAGNOSIS — N760 Acute vaginitis: Secondary | ICD-10-CM | POA: Insufficient documentation

## 2023-09-03 DIAGNOSIS — J45909 Unspecified asthma, uncomplicated: Secondary | ICD-10-CM | POA: Insufficient documentation

## 2023-09-03 DIAGNOSIS — N898 Other specified noninflammatory disorders of vagina: Secondary | ICD-10-CM | POA: Diagnosis present

## 2023-09-03 LAB — URINALYSIS, ROUTINE W REFLEX MICROSCOPIC
Bilirubin Urine: NEGATIVE
Glucose, UA: NEGATIVE mg/dL
Hgb urine dipstick: NEGATIVE
Ketones, ur: 5 mg/dL — AB
Nitrite: NEGATIVE
Protein, ur: 30 mg/dL — AB
Specific Gravity, Urine: 1.027 (ref 1.005–1.030)
pH: 5 (ref 5.0–8.0)

## 2023-09-03 LAB — WET PREP, GENITAL
Sperm: NONE SEEN
Trich, Wet Prep: NONE SEEN
WBC, Wet Prep HPF POC: >=10 — AB (ref <10)
Yeast Wet Prep HPF POC: NONE SEEN

## 2023-09-03 LAB — POC URINE PREG, ED: Preg Test, Ur: NEGATIVE

## 2023-09-03 MED ORDER — CLINDAMYCIN HCL 150 MG PO CAPS: 300.0000 mg | ORAL_CAPSULE | Freq: Three times a day (TID) | ORAL | 0 refills | Status: DC

## 2023-09-03 NOTE — ED Provider Notes (Signed)
[facility] Provider Note    Event Date/Time   First MD Initiated Contact with Patient 09/03/23 1137     (approximate)   History   Vaginal Discharge   HPI  Priscilla Cummings is a 22 y.o. female with history of asthma and eczema presents emergency department with vaginal discharge and odor.  She thinks he has bacterial vaginosis as it has a fishy odor.  States that she is not concerned about an STD.  She was tested for that recently.  Has not changed partners.  Denies fever or chills.  Some burning with intercourse.  But no pain.  No dysuria.  Patient states its time for her IUD to be removed and is wondering if that is creating problems      Physical Exam   Triage Vital Signs: ED Triage Vitals  Encounter Vitals Group     BP 09/03/23 1030 123/85     Systolic BP Percentile --      Diastolic BP Percentile --      Pulse Rate 09/03/23 1030 88     Resp 09/03/23 1030 18     Temp 09/03/23 1030 98.6 F (37 C)     Temp src --      SpO2 09/03/23 1030 100 %     Weight 09/03/23 1040 119 lb 0.8 oz (54 kg)     Height 09/03/23 1040 5\' 3"  (1.6 m)     Head Circumference --      Peak Flow --      Pain Score 09/03/23 1031 2     Pain Loc --      Pain Education --      Exclude from Growth Chart --     Most recent vital signs: Vitals:   09/03/23 1030  BP: 123/85  Pulse: 88  Resp: 18  Temp: 98.6 F (37 C)  SpO2: 100%     General: Awake, no distress.   CV:  Good peripheral perfusion. regular rate and  rhythm Resp:  Normal effort.  Abd:  No distention.   Other:  Pelvic exam deferred by patient, self swab initiated   ED Results / Procedures / Treatments   Labs (all labs ordered are listed, but only abnormal results are displayed) Labs Reviewed  WET PREP, GENITAL - Abnormal; Notable for the following components:      Result Value   Clue Cells Wet Prep HPF POC PRESENT (*)    WBC, Wet Prep HPF POC >=10 (*)    All other components within normal limits   URINALYSIS, ROUTINE W REFLEX MICROSCOPIC - Abnormal; Notable for the following components:   Color, Urine YELLOW (*)    APPearance CLOUDY (*)    Ketones, ur 5 (*)    Protein, ur 30 (*)    Leukocytes,Ua MODERATE (*)    Bacteria, UA RARE (*)    All other components within normal limits  POC URINE PREG, ED     EKG     RADIOLOGY     PROCEDURES:   Procedures   MEDICATIONS ORDERED IN ED: Medications - No data to display   IMPRESSION / MDM / ASSESSMENT AND PLAN / ED COURSE  I reviewed the triage vital signs and the nursing notes.                              Differential diagnosis includes, but is not limited to, vaginosis, candidiasis, vaginitis  Patient's  presentation is most consistent with acute complicated illness / injury requiring diagnostic workup.   Patient's POC pregnancy negative, UA shows moderate leuks, wet prep shows clue cells and WBCs  Patient's diagnosis will be bacterial vaginosis.  Since she failed on the Flagyl we will start her on clindamycin.  She is to follow-up with the GYN doctor.  Return emergency department worsening.  She is in agreement treatment plan.  Discharged stable condition.      FINAL CLINICAL IMPRESSION(S) / ED DIAGNOSES   Final diagnoses:  BV (bacterial vaginosis)     Rx / DC Orders   ED Discharge Orders          Ordered    clindamycin (CLEOCIN) 150 MG capsule  3 times daily        09/03/23 1235             Note:  This document was prepared using Dragon voice recognition software and may include unintentional dictation errors.    Faythe Ghee, PA-C 09/03/23 1236    Merwyn Katos, MD 09/04/23 [phone]

## 2023-09-03 NOTE — ED Triage Notes (Signed)
Pt to ED for vaginal discharge with odor. States thinks has BV. States would have came sooner over the weekend but wanted to come on business day when there would be more nurses to help.

## 2023-09-03 NOTE — Discharge Instructions (Signed)
Take a daily probiotic to help with the vaginal flora balance. Take medication as prescribed Follow-up with your doctor for concerns about your IUD

## 2023-09-26 ENCOUNTER — Ambulatory Visit: Payer: MEDICAID

## 2023-10-01 ENCOUNTER — Encounter: Payer: Self-pay | Admitting: Family Medicine

## 2023-10-01 ENCOUNTER — Ambulatory Visit: Payer: MEDICAID | Admitting: Family Medicine

## 2023-10-01 VITALS — BP 109/70 | HR 72 | Ht 63.0 in | Wt 121.0 lb

## 2023-10-01 DIAGNOSIS — Z3009 Encounter for other general counseling and advice on contraception: Secondary | ICD-10-CM

## 2023-10-01 DIAGNOSIS — Z01419 Encounter for gynecological examination (general) (routine) without abnormal findings: Secondary | ICD-10-CM | POA: Diagnosis not present

## 2023-10-01 DIAGNOSIS — Z113 Encounter for screening for infections with a predominantly sexual mode of transmission: Secondary | ICD-10-CM

## 2023-10-01 LAB — HM HIV SCREENING LAB: HM HIV Screening: NEGATIVE

## 2023-10-01 LAB — WET PREP FOR TRICH, YEAST, CLUE
Trichomonas Exam: NEGATIVE
Yeast Exam: NEGATIVE

## 2023-10-01 NOTE — Progress Notes (Signed)
[facility] DEPARTMENT [facility] [address] Main Number: [phone]  Family Planning Visit- Repeat Yearly Visit  Subjective:  Priscilla Cummings is a 22 y.o. G0P0000  being seen today for an annual wellness visit and to discuss contraception options.   The patient is currently using IUD or IUS for pregnancy prevention. Patient does not know want a pregnancy in the next year.    report they are looking for a method that provides High efficacy at preventing pregnancy   Patient has the following medical problems: has AR (allergic rhinitis); Asthma; Anaphylactic shock; Allergic reaction; and Pyelonephritis dx'd 12/14/21 ER on their problem list.  Chief Complaint  Patient presents with   Annual Exam    Patient reports to clinic for IUD removal. Reports that since having the IUD placed- she has had more UTIs. I reviewed basic anatomy and why I did not believe the IUD was causing UTI. Pt additionally reports she might not want to be pregnant in the next year. She reports that she thought her fertility would be delayed. I reviewed that patient's generally return back to baseline right after IUD removal, and I would not recommend removal if she didn't want to get pregnant now.   Patient denies concerns about self   See flowsheet for other program required questions.   Body mass index is 21.43 kg/m. - Patient is eligible for diabetes screening based on BMI> 25 and age >35?  no HA1C ordered? not applicable  Patient reports 1 of partners in last year. Desires STI screening?  Yes   Has patient been screened once for HCV in the past?  No  No results found for: "HCVAB"  Does the patient have current of drug use, have a partner with drug use, and/or has been incarcerated since last result? No  If yes-- Screen for HCV through [facility]   Does the patient meet criteria for HBV testing? No  Criteria:  -Household, sexual or needle sharing contact with  HBV -History of drug use -HIV positive -Those with known Hep C   Health Maintenance Due  Topic Date Due   Hepatitis C Screening  Never done   INFLUENZA VACCINE  06/28/2023   COVID-19 Vaccine (4 - 2023-24 season) 07/29/2023    Review of Systems  Constitutional:  Negative for weight loss.  Eyes:  Negative for blurred vision.  Respiratory:  Negative for cough and shortness of breath.   Cardiovascular:  Negative for claudication.  Gastrointestinal:  Negative for nausea.  Genitourinary:  Negative for dysuria and frequency.  Skin:  Negative for rash.  Neurological:  Negative for headaches.  Endo/Heme/Allergies:  Does not bruise/bleed easily.    The following portions of the patient's history were reviewed and updated as appropriate: allergies, current medications, past family history, past medical history, past social history, past surgical history and problem list. Problem list updated.  Objective:   Vitals:   10/01/23 0837  BP: 109/70  Pulse: 72  Weight: 121 lb (54.9 kg)  Height: 5\' 3"  (1.6 m)    Physical Exam Vitals and nursing note reviewed. Exam conducted with a chaperone present (declined chaperone).  Constitutional:      Appearance: Normal appearance.  HENT:     Head: Normocephalic and atraumatic.     Mouth/Throat:     Mouth: Mucous membranes are moist.     Pharynx: Oropharynx is clear. No oropharyngeal exudate or posterior oropharyngeal erythema.  Pulmonary:     Effort: Pulmonary effort  is normal.  Abdominal:     General: Abdomen is flat.     Palpations: There is no mass.     Tenderness: There is no abdominal tenderness. There is no rebound.  Genitourinary:    General: Normal vulva.     Exam position: Lithotomy position.     Pubic Area: No rash or pubic lice.      Labia:        Right: No rash or lesion.        Left: No rash or lesion.      Rectum: Normal.     Comments: Declined speculum and bimanual exam Lymphadenopathy:     Head:     Right side of  head: No preauricular or posterior auricular adenopathy.     Left side of head: No preauricular or posterior auricular adenopathy.     Cervical: No cervical adenopathy.     Upper Body:     Right upper body: No supraclavicular, axillary or epitrochlear adenopathy.     Left upper body: No supraclavicular, axillary or epitrochlear adenopathy.     Lower Body: No right inguinal adenopathy. No left inguinal adenopathy.  Skin:    General: Skin is warm and dry.     Findings: No rash.  Neurological:     Mental Status: She is alert and oriented to person, place, and time.       Assessment and Plan:  Priscilla Cummings is a 22 y.o. female G0P0000 presenting to the [facility] Department for an yearly wellness and contraception visit  1. Family planning Contraception counseling: Reviewed options based on patient desire and reproductive life plan. Patient is interested in IUD or IUS. This was not provided to the patient today. IUD already in place  Risks, benefits, and typical effectiveness rates were reviewed.  Questions were answered.  Written information was also given to the patient to review.    The patient will follow up in  1 years for surveillance.  The patient was told to call with any further questions, or with any concerns about this method of contraception.  Emphasized use of condoms 100% of the time for STI prevention.  Educated on ECP and assessed need for ECP. Not indicated- IUD in  place  2. Well woman exam with routine gynecological exam CBE not indicated until 25 per ACOG guidelines Pap smear not indicated until 21 per ASCCP guidelines  3. Screening for venereal disease  - Chlamydia/Gonorrhea [place] [facility] - HIV [place] [facility] - Syphilis Serology, [place] [facility] - WET PREP FOR TRICH, YEAST, CLUE   Return in about 1 year (around 09/30/2024) for annual well-woman exam.  No future appointments.  Lenice Llamas, [place]

## 2023-10-01 NOTE — Progress Notes (Signed)
Pt is here for family planning visit.  Family planning packet reviewed and given to pt.  Wet prep results reviewed, no treatment required per standing orders. Condoms declined. Gaspar Garbe, RN

## 2023-11-06 ENCOUNTER — Emergency Department
Admission: EM | Admit: 2023-11-06 | Discharge: 2023-11-06 | Disposition: A | Discharge status: Home / Self Care | Payer: MEDICAID | Attending: Emergency Medicine | Admitting: Emergency Medicine

## 2023-11-06 ENCOUNTER — Emergency Department: Payer: MEDICAID

## 2023-11-06 ENCOUNTER — Other Ambulatory Visit: Payer: Self-pay

## 2023-11-06 DIAGNOSIS — N939 Abnormal uterine and vaginal bleeding, unspecified: Secondary | ICD-10-CM

## 2023-11-06 DIAGNOSIS — N760 Acute vaginitis: Secondary | ICD-10-CM | POA: Insufficient documentation

## 2023-11-06 DIAGNOSIS — R102 Pelvic and perineal pain: Secondary | ICD-10-CM

## 2023-11-06 DIAGNOSIS — R1030 Lower abdominal pain, unspecified: Secondary | ICD-10-CM

## 2023-11-06 DIAGNOSIS — Z711 Person with feared health complaint in whom no diagnosis is made: Secondary | ICD-10-CM

## 2023-11-06 DIAGNOSIS — B9689 Other specified bacterial agents as the cause of diseases classified elsewhere: Secondary | ICD-10-CM | POA: Insufficient documentation

## 2023-11-06 LAB — COMPREHENSIVE METABOLIC PANEL
ALT: 13 U/L (ref 0–44)
AST: 15 U/L (ref 15–41)
Albumin: 4.4 g/dL (ref 3.5–5.0)
Alkaline Phosphatase: 51 U/L (ref 38–126)
Anion gap: 8 (ref 5–15)
BUN: 8 mg/dL (ref 6–20)
CO2: 24 mmol/L (ref 22–32)
Calcium: 8.9 mg/dL (ref 8.9–10.3)
Chloride: 108 mmol/L (ref 98–111)
Creatinine, Ser: 0.6 mg/dL (ref 0.44–1.00)
GFR, Estimated: >60 mL/min (ref >60)
Glucose, Bld: 94 mg/dL (ref 70–99)
Potassium: 3.5 mmol/L (ref 3.5–5.1)
Sodium: 140 mmol/L (ref 135–145)
Total Bilirubin: 1 mg/dL (ref <1.2)
Total Protein: 7.4 g/dL (ref 6.5–8.1)

## 2023-11-06 LAB — URINALYSIS, ROUTINE W REFLEX MICROSCOPIC
Bilirubin Urine: NEGATIVE
Glucose, UA: NEGATIVE mg/dL
Ketones, ur: NEGATIVE mg/dL
Leukocytes,Ua: NEGATIVE
Nitrite: NEGATIVE
Protein, ur: 30 mg/dL — AB
Specific Gravity, Urine: 1.024 (ref 1.005–1.030)
WBC, UA: >50 WBC/hpf (ref 0–5)
pH: 6 (ref 5.0–8.0)

## 2023-11-06 LAB — CBC WITH DIFFERENTIAL/PLATELET
Abs Immature Granulocytes: 0.01 10*3/uL (ref 0.00–0.07)
Basophils Absolute: 0.1 10*3/uL (ref 0.0–0.1)
Basophils Relative: 1 %
Eosinophils Absolute: 0.6 10*3/uL — ABNORMAL HIGH (ref 0.0–0.5)
Eosinophils Relative: 10 %
HCT: 43.3 % (ref 36.0–46.0)
Hemoglobin: 14.7 g/dL (ref 12.0–15.0)
Immature Granulocytes: 0 %
Lymphocytes Relative: 29 %
Lymphs Abs: 1.7 10*3/uL (ref 0.7–4.0)
MCH: 32.8 pg (ref 26.0–34.0)
MCHC: 33.9 g/dL (ref 30.0–36.0)
MCV: 96.7 fL (ref 80.0–100.0)
Monocytes Absolute: 0.4 10*3/uL (ref 0.1–1.0)
Monocytes Relative: 7 %
Neutro Abs: 3.2 10*3/uL (ref 1.7–7.7)
Neutrophils Relative %: 53 %
Platelets: 230 10*3/uL (ref 150–400)
RBC: 4.48 MIL/uL (ref 3.87–5.11)
RDW: 11.2 % — ABNORMAL LOW (ref 11.5–15.5)
WBC: 6 10*3/uL (ref 4.0–10.5)
nRBC: 0 % (ref 0.0–0.2)

## 2023-11-06 LAB — WET PREP, GENITAL
Sperm: NONE SEEN
Trich, Wet Prep: NONE SEEN
WBC, Wet Prep HPF POC: <10 (ref <10)
Yeast Wet Prep HPF POC: NONE SEEN

## 2023-11-06 LAB — CHLAMYDIA/NGC RT PCR (ARMC ONLY)
Chlamydia Tr: NOT DETECTED
N gonorrhoeae: NOT DETECTED

## 2023-11-06 LAB — POC URINE PREG, ED: Preg Test, Ur: NEGATIVE

## 2023-11-06 LAB — PREGNANCY, URINE: Preg Test, Ur: NEGATIVE

## 2023-11-06 MED ORDER — METRONIDAZOLE 500 MG PO TABS: 500.0000 mg | ORAL_TABLET | Freq: Three times a day (TID) | ORAL | 0 refills | Status: AC

## 2023-11-06 MED ORDER — METRONIDAZOLE 500 MG PO TABS: 500.0000 mg | ORAL_TABLET | Freq: Three times a day (TID) | ORAL | 0 refills | Status: DC

## 2023-11-06 NOTE — ED Provider Notes (Signed)
[facility] Emergency Department Provider Note     Event Date/Time   First MD Initiated Contact with Patient 11/06/23 1627     (approximate)   History   Vaginal Discharge   HPI  Priscilla Cummings is a 22 y.o. female presents to the ED for evaluation of vaginal bleeding and BV.  Patient has an IUD and is having lower abdominal cramping that started yesterday.  Denies fever.    Physical Exam   Triage Vital Signs: ED Triage Vitals  Encounter Vitals Group     BP 11/06/23 1323 (!) 109/98     Systolic BP Percentile --      Diastolic BP Percentile --      Pulse Rate 11/06/23 1323 72     Resp 11/06/23 1323 16     Temp 11/06/23 1323 98.3 F (36.8 C)     Temp Source 11/06/23 1323 Oral     SpO2 11/06/23 1323 95 %     Weight 11/06/23 1324 120 lb (54.4 kg)     Height 11/06/23 1324 5\' 3"  (1.6 m)     Head Circumference --      Peak Flow --      Pain Score 11/06/23 1324 6     Pain Loc --      Pain Education --      Exclude from Growth Chart --     Most recent vital signs: Vitals:   11/06/23 1323 11/06/23 1718  BP: (!) 109/98 116/72  Pulse: 72 93  Resp: 16 17  Temp: 98.3 F (36.8 C) 98.1 F (36.7 C)  SpO2: 95% 99%    General: Well appearing. Alert and oriented. INAD.  Skin:  Warm, dry and intact. No rashes or lesions noted.     Head:  NCAT.  Eyes:  PERRLA. EOMI.  CV:  Good peripheral perfusion. RRR.  RESP:  Normal effort. LCTAB.  ABD:  No distention. Soft, lower abdominal tenderness bilaterally.  No CVA tenderness bilaterally. OTHER:  Pelvic exam performed.  IUD strings visualized.  No cervical motion tenderness.  Mild bleeding noted.  Azo boric acid can be seen in vagina.  No discharge    ED Results / Procedures / Treatments   Labs (all labs ordered are listed, but only abnormal results are displayed) Labs Reviewed  WET PREP, GENITAL - Abnormal; Notable for the following components:      Result Value   Clue Cells Wet Prep HPF POC  PRESENT (*)    All other components within normal limits  URINALYSIS, ROUTINE W REFLEX MICROSCOPIC - Abnormal; Notable for the following components:   Color, Urine YELLOW (*)    APPearance HAZY (*)    Hgb urine dipstick LARGE (*)    Protein, ur 30 (*)    Bacteria, UA RARE (*)    All other components within normal limits  CBC WITH DIFFERENTIAL/PLATELET - Abnormal; Notable for the following components:   RDW 11.2 (*)    Eosinophils Absolute 0.6 (*)    All other components within normal limits  CHLAMYDIA/NGC RT PCR ([facility] ONLY)            COMPREHENSIVE METABOLIC PANEL  PREGNANCY, URINE  POC URINE PREG, ED   RADIOLOGY  I personally viewed and evaluated these images as part of my medical decision making, as well as reviewing the written report by the radiologist.  ED Provider Interpretation: Ultrasound does not reveal an ovarian torsion or infection of the endometrium.  US PELVIC  TRANSABD W/PELVIC DOPPLER  Result Date: 11/06/2023 CLINICAL DATA:  Vaginal bleeding and pelvic cramping. EXAM: TRANSABDOMINAL ULTRASOUND OF PELVIS DOPPLER ULTRASOUND OF OVARIES TECHNIQUE: Transabdominal ultrasound examination of the pelvis was performed including evaluation of the uterus, ovaries, adnexal regions, and pelvic cul-de-sac. Color and duplex Doppler ultrasound was utilized to evaluate blood flow to the ovaries. COMPARISON:  None Available. FINDINGS: Uterus Measurements: 7.0 x 3.6 x 4.9 cm = volume: 64 mL. No fibroids or other mass visualized. Endometrium Thickness: 3 mm. An intrauterine device is noted with stem in the endometrium and arms in the fundal endometrium. Right ovary Measurements: 4.4 x 2.1 x 2.8 cm = volume: 13.4 mL. Normal appearance/no adnexal mass. Left ovary Measurements: 3.7 x 1.8 x 3.5 cm = volume: 11.8 mL. Normal appearance/no adnexal mass. Pulsed Doppler evaluation of both ovaries demonstrates normal low-resistance arterial and venous waveforms. Other findings No abnormal free fluid.  IMPRESSION: Unremarkable pelvic ultrasound. The IUD appears in appropriate position. Electronically Signed   By: Elgie Collard M.D.   On: 11/06/2023 20:55    PROCEDURES:  Critical Care performed: No  Procedures   MEDICATIONS ORDERED IN ED: Medications - No data to display   IMPRESSION / MDM / ASSESSMENT AND PLAN / ED COURSE  I reviewed the triage vital signs and the nursing notes.                               22 y.o. female presents to the emergency department for evaluation and treatment of vaginal bleeding. See HPI for further details.   Differential diagnosis includes, but is not limited to, ovarian cyst, ovarian torsion, acute appendicitis, diverticulitis, urinary tract infection/pyelonephritis, endometriosis  Patient's presentation is most consistent with acute complicated illness / injury requiring diagnostic workup.  Patient is alert and oriented.  She is hemodynamically stable and afebrile.  Physical exam findings are as stated above.  Lab work is reassuring.  Urinalysis reveals large amount Hgb, protein and some bacteria.  No urinary symptoms to indicate antibiotics at this time.  Chlamydia gonorrhea is negative.  Wet prep reveals positive clue cells.  Will send Flagyl into pharmacy.  Ultrasound is reassuring.  Patient will be discharged in stable condition home.  ED precautions discussed.  She is encouraged to follow-up with her OB/GYN.   FINAL CLINICAL IMPRESSION(S) / ED DIAGNOSES   Final diagnoses:  Vaginal bleeding  BV (bacterial vaginosis)  Concern about STD in female without diagnosis    Rx / DC Orders   ED Discharge Orders          Ordered    metroNIDAZOLE (FLAGYL) 500 MG tablet  3 times daily,   Status:  Discontinued        11/06/23 1747    metroNIDAZOLE (FLAGYL) 500 MG tablet  3 times daily        11/06/23 1804             Note:  This document was prepared using Dragon voice recognition software and may include unintentional dictation  errors.    Romeo Apple, Jeriko Kowalke A, PA-C 11/07/23 0005    Concha Se, MD 11/08/23 [phone]

## 2023-11-06 NOTE — Discharge Instructions (Addendum)
You were evaluated in the ED for vaginal bleeding.  Your blood work is reassuring.  Your STD panel is negative.  Your BV is positive which antibiotics has been sent to your pharmacy.  Please take this antibiotic until dose is complete.  Your ultrasound is normal.  Please follow-up with your OB/GYN for further evaluation.

## 2023-11-06 NOTE — ED Triage Notes (Signed)
Pt c/o concerns of BV with vaginal bleeding longer than her normal menstrual cycle with lower back pain and and cramps. Pt has hx of asthma

## 2023-11-29 ENCOUNTER — Other Ambulatory Visit: Payer: Self-pay

## 2023-11-29 ENCOUNTER — Emergency Department
Admission: EM | Admit: 2023-11-29 | Discharge: 2023-11-29 | Discharge status: Against Medical Advice | Payer: MEDICAID | Attending: Emergency Medicine | Admitting: Emergency Medicine

## 2023-11-29 DIAGNOSIS — Z5321 Procedure and treatment not carried out due to patient leaving prior to being seen by health care provider: Secondary | ICD-10-CM | POA: Diagnosis not present

## 2023-11-29 DIAGNOSIS — Z202 Contact with and (suspected) exposure to infections with a predominantly sexual mode of transmission: Secondary | ICD-10-CM | POA: Diagnosis present

## 2023-11-29 NOTE — ED Triage Notes (Signed)
 Pt to ED via POV from home. Pt has multiple complaints. Pt reports STD testing and reports unprotected intercourse a few days ago. Pt also reports wanting to make sure her IUD is still in place.

## 2023-11-29 NOTE — ED Provider Triage Note (Signed)
 Emergency Medicine Provider Triage Evaluation Note  Priscilla Cummings , a 23 y.o. female  was evaluated in triage.  Pt complains of needing STD testing. Patient had unprotected sex and needs testing. No symptoms, also wants to make sure her IUD is in place.   Review of Systems  Positive:  Negative:   Physical Exam  BP (!) 123/90   Pulse 85   Temp 98.5 F (36.9 C) (Oral)   Resp 18   SpO2 98%  Gen:   Awake, no distress   Resp:  Normal effort  MSK:   Moves extremities without difficulty  Other:    Medical Decision Making  Medically screening exam initiated at 4:25 PM.  Appropriate orders placed.  ALEEN MARSTON was informed that the remainder of the evaluation will be completed by another provider, this initial triage assessment does not replace that evaluation, and the importance of remaining in the ED until their evaluation is complete.     Cleaster Tinnie LABOR, PA-C 11/29/23 1626

## 2023-12-30 ENCOUNTER — Other Ambulatory Visit: Payer: Self-pay

## 2023-12-30 ENCOUNTER — Emergency Department
Admission: EM | Admit: 2023-12-30 | Discharge: 2023-12-30 | Disposition: A | Discharge status: Home / Self Care | Payer: MEDICAID | Attending: Emergency Medicine | Admitting: Emergency Medicine

## 2023-12-30 DIAGNOSIS — Z202 Contact with and (suspected) exposure to infections with a predominantly sexual mode of transmission: Secondary | ICD-10-CM | POA: Diagnosis present

## 2023-12-30 DIAGNOSIS — Z113 Encounter for screening for infections with a predominantly sexual mode of transmission: Secondary | ICD-10-CM

## 2023-12-30 LAB — WET PREP, GENITAL
Sperm: NONE SEEN
Trich, Wet Prep: NONE SEEN
WBC, Wet Prep HPF POC: >=10 — AB (ref <10)
Yeast Wet Prep HPF POC: NONE SEEN

## 2023-12-30 LAB — URINALYSIS, ROUTINE W REFLEX MICROSCOPIC
Bilirubin Urine: NEGATIVE
Glucose, UA: NEGATIVE mg/dL
Hgb urine dipstick: NEGATIVE
Ketones, ur: NEGATIVE mg/dL
Nitrite: NEGATIVE
Protein, ur: NEGATIVE mg/dL
Specific Gravity, Urine: 1.018 (ref 1.005–1.030)
pH: 5 (ref 5.0–8.0)

## 2023-12-30 LAB — CHLAMYDIA/NGC RT PCR (ARMC ONLY)
Chlamydia Tr: NOT DETECTED
N gonorrhoeae: NOT DETECTED

## 2023-12-30 LAB — POC URINE PREG, ED: Preg Test, Ur: NEGATIVE

## 2023-12-30 NOTE — ED Provider Notes (Signed)
[facility] Emergency Department Provider Note     Event Date/Time   First MD Initiated Contact with Patient 12/30/23 1937     (approximate)   History   STI testing   HPI  Priscilla Cummings is a 23 y.o. female presents to the ED for STD screening.  Patient reports she was told by a friend that her significant other had chlamydia and she is here to be checked.  She reports she would like to be tested for everything.  Patient is asymptomatic.  She declines dysuria, vaginal itching, vaginal discharge and vaginal bleeding.  Denies new rashes or lesions, no abdominal pain.     Physical Exam   Triage Vital Signs: ED Triage Vitals  Encounter Vitals Group     BP 12/30/23 1828 121/86     Systolic BP Percentile --      Diastolic BP Percentile --      Pulse Rate 12/30/23 1828 93     Resp 12/30/23 1828 16     Temp 12/30/23 1828 98.2 F (36.8 C)     Temp Source 12/30/23 1828 Oral     SpO2 12/30/23 1828 98 %     Weight 12/30/23 1829 119 lb (54 kg)     Height 12/30/23 1829 5\' 3"  (1.6 m)     Head Circumference --      Peak Flow --      Pain Score 12/30/23 1827 0     Pain Loc --      Pain Education --      Exclude from Growth Chart --     Most recent vital signs: Vitals:   12/30/23 1828  BP: 121/86  Pulse: 93  Resp: 16  Temp: 98.2 F (36.8 C)  SpO2: 98%    General Awake, no distress.  HEENT NCAT. PERRL. EOMI. No rhinorrhea. Mucous membranes are moist.  CV:  Good peripheral perfusion.  RESP:  Normal effort.  ABD:  No distention.     ED Results / Procedures / Treatments   Labs (all labs ordered are listed, but only abnormal results are displayed) Labs Reviewed  WET PREP, GENITAL - Abnormal; Notable for the following components:      Result Value   Clue Cells Wet Prep HPF POC PRESENT (*)    WBC, Wet Prep HPF POC >=10 (*)    All other components within normal limits  URINALYSIS, ROUTINE W REFLEX MICROSCOPIC - Abnormal; Notable for the  following components:   Color, Urine YELLOW (*)    APPearance HAZY (*)    Leukocytes,Ua TRACE (*)    Bacteria, UA RARE (*)    All other components within normal limits  CHLAMYDIA/NGC RT PCR ([facility] ONLY)            RPR  HIV ANTIBODY (ROUTINE TESTING W REFLEX)  POC URINE PREG, ED   No results found.  PROCEDURES:  Critical Care performed: No  Procedures   MEDICATIONS ORDERED IN ED: Medications - No data to display   IMPRESSION / MDM / ASSESSMENT AND PLAN / ED COURSE  I reviewed the triage vital signs and the nursing notes.                               23 y.o. female presents to the emergency department for evaluation and treatment of STD screening. See HPI for further details.   Differential diagnosis includes, but is not limited  to chlamydia, gonorrhea, HIV, syphilis  Patient's presentation is most consistent with acute complicated illness / injury requiring diagnostic workup.  Patient is alert and oriented.  She is hemodynamically stable.  Physical exam findings are benign.  Pending STD screening.  Will call patient of positive results.  Patient is in stable condition for discharge home.  Encouraged to follow-up with health department for further evaluation and continuous STD screening.  FINAL CLINICAL IMPRESSION(S) / ED DIAGNOSES   Final diagnoses:  Screening examination for STD (sexually transmitted disease)   Rx / DC Orders   ED Discharge Orders     None        Note:  This document was prepared using Dragon voice recognition software and may include unintentional dictation errors.    Romeo Apple, Persephonie Hegwood A, PA-C 12/31/23 Pernell Dupre    Jene Every, MD 01/01/24 [phone]

## 2023-12-30 NOTE — Discharge Instructions (Signed)
Your evaluated in the ED for STD screening.  Your test are pending.  You will receive a phone call if test results are positive for further evaluation and appropriate medication.  In the interim please refrain from sexual activity.  Follow-up with the health department for more information.

## 2023-12-30 NOTE — ED Triage Notes (Signed)
Pt to ED for STI testing, stating had sex with someone who she heard afterwards had chlamydia. Denies abnormal discharge or pain or discomfort. Denies urinary symptoms. Uses IUD, irregular spotting only. States wanting to be "tested for everything". Pt in NAD.

## 2023-12-31 LAB — HIV ANTIBODY (ROUTINE TESTING W REFLEX): HIV Screen 4th Generation wRfx: NONREACTIVE

## 2023-12-31 LAB — RPR: RPR Ser Ql: NONREACTIVE

## 2024-01-14 ENCOUNTER — Emergency Department
Admission: EM | Admit: 2024-01-14 | Discharge: 2024-01-14 | Disposition: A | Discharge status: Home / Self Care | Payer: MEDICAID | Attending: Emergency Medicine | Admitting: Emergency Medicine

## 2024-01-14 ENCOUNTER — Emergency Department: Payer: MEDICAID

## 2024-01-14 DIAGNOSIS — J101 Influenza due to other identified influenza virus with other respiratory manifestations: Secondary | ICD-10-CM

## 2024-01-14 DIAGNOSIS — J4541 Moderate persistent asthma with (acute) exacerbation: Secondary | ICD-10-CM

## 2024-01-14 DIAGNOSIS — R002 Palpitations: Secondary | ICD-10-CM | POA: Diagnosis present

## 2024-01-14 LAB — CBC
HCT: 40.7 % (ref 36.0–46.0)
Hemoglobin: 13.9 g/dL (ref 12.0–15.0)
MCH: 32.6 pg (ref 26.0–34.0)
MCHC: 34.2 g/dL (ref 30.0–36.0)
MCV: 95.5 fL (ref 80.0–100.0)
Platelets: 161 10*3/uL (ref 150–400)
RBC: 4.26 MIL/uL (ref 3.87–5.11)
RDW: 11.5 % (ref 11.5–15.5)
WBC: 5 10*3/uL (ref 4.0–10.5)
nRBC: 0 % (ref 0.0–0.2)

## 2024-01-14 LAB — BASIC METABOLIC PANEL
Anion gap: 13 (ref 5–15)
BUN: 8 mg/dL (ref 6–20)
CO2: 20 mmol/L — ABNORMAL LOW (ref 22–32)
Calcium: 9 mg/dL (ref 8.9–10.3)
Chloride: 105 mmol/L (ref 98–111)
Creatinine, Ser: 0.66 mg/dL (ref 0.44–1.00)
GFR, Estimated: >60 mL/min (ref >60)
Glucose, Bld: 131 mg/dL — ABNORMAL HIGH (ref 70–99)
Potassium: 2.9 mmol/L — ABNORMAL LOW (ref 3.5–5.1)
Sodium: 138 mmol/L (ref 135–145)

## 2024-01-14 LAB — RESP PANEL BY RT-PCR (RSV, FLU A&B, COVID)  RVPGX2
Influenza A by PCR: POSITIVE — AB
Influenza B by PCR: NEGATIVE
Resp Syncytial Virus by PCR: NEGATIVE
SARS Coronavirus 2 by RT PCR: NEGATIVE

## 2024-01-14 LAB — TROPONIN I (HIGH SENSITIVITY): Troponin I (High Sensitivity): 3 ng/L (ref <18)

## 2024-01-14 MED ORDER — IPRATROPIUM-ALBUTEROL 0.5-2.5 (3) MG/3ML IN SOLN
3.0000 mL | Freq: Once | RESPIRATORY_TRACT | Status: AC
  Administered 2024-01-14: 3 mL via RESPIRATORY_TRACT
  Filled 2024-01-14: qty 3

## 2024-01-14 MED ORDER — LACTATED RINGERS IV BOLUS
1000.0000 mL | Freq: Once | INTRAVENOUS | Status: AC
  Administered 2024-01-14: 1000 mL via INTRAVENOUS

## 2024-01-14 MED ORDER — ALBUTEROL SULFATE HFA 108 (90 BASE) MCG/ACT IN AERS: INHALATION_SPRAY | RESPIRATORY_TRACT | 0 refills | Status: DC

## 2024-01-14 MED ORDER — ALBUTEROL SULFATE (2.5 MG/3ML) 0.083% IN NEBU
2.5000 mg | INHALATION_SOLUTION | Freq: Once | RESPIRATORY_TRACT | Status: AC
  Administered 2024-01-14: 2.5 mg via RESPIRATORY_TRACT
  Filled 2024-01-14: qty 3

## 2024-01-14 MED ORDER — POTASSIUM CHLORIDE CRYS ER 20 MEQ PO TBCR: 20.0000 meq | EXTENDED_RELEASE_TABLET | Freq: Every day | ORAL | 0 refills | Status: AC

## 2024-01-14 MED ORDER — METHYLPREDNISOLONE SODIUM SUCC 125 MG IJ SOLR
125.0000 mg | Freq: Once | INTRAMUSCULAR | Status: AC
  Administered 2024-01-14: 125 mg via INTRAVENOUS
  Filled 2024-01-14: qty 2

## 2024-01-14 MED ORDER — PREDNISONE 10 MG PO TABS: ORAL_TABLET | ORAL | 0 refills | Status: DC

## 2024-01-14 MED ORDER — POTASSIUM CHLORIDE 20 MEQ PO PACK
40.0000 meq | PACK | ORAL | Status: AC
Start: 2024-01-14 — End: 2024-01-14
  Administered 2024-01-14: 40 meq via ORAL
  Filled 2024-01-14: qty 2

## 2024-01-14 NOTE — ED Triage Notes (Addendum)
Pt to ed from home via POV for SOB and tachycardia. Pt thinks she might have the flu. She has felt bad since Friday. Pt is caox4, very anxious and restless in triage but ambulatory. Pt has audible expiratory wheezing.

## 2024-01-14 NOTE — ED Provider Notes (Addendum)
[facility] Provider Note    Event Date/Time   First MD Initiated Contact with Patient 01/14/24 0126     (approximate)   History   Tachycardia   HPI Priscilla Cummings is a 23 y.o. female with a history of asthma who presents for shortness of breath.  She said that she has felt like she has the flu all day with fever/chills, general malaise, and bodyaches.  Her breathing is also been worse than usual despite trying to use nebulizer treatments and in the inhaler at home.  Exertion makes her symptoms a lot worse, nothing in particular makes it better.  She has had nausea but no vomiting.  No pain per se other than the generalized bodyaches.     Physical Exam   Triage Vital Signs: ED Triage Vitals [01/14/24 0106]  Encounter Vitals Group     BP 122/78     Systolic BP Percentile      Diastolic BP Percentile      Pulse Rate (!) 145     Resp (!) 26     Temp 98.7 F (37.1 C)     Temp src      SpO2 100 %     Weight      Height      Head Circumference      Peak Flow      Pain Score      Pain Loc      Pain Education      Exclude from Growth Chart     Most recent vital signs: Vitals:   01/14/24 0230 01/14/24 0300  BP:  124/68  Pulse:  (!) 125  Resp: (!) 23 (!) 22  Temp:    SpO2:  97%    General: Awake, moderate respiratory distress.  Alert and oriented. CV:  Good peripheral perfusion.  Moderate tachycardia, regular rhythm, normal heart sounds. Resp:  Increased respiratory effort with accessory muscle usage and intercostal retractions.  Expiratory wheezing throughout all lung fields and some inspiratory wheezing. Abd:  No distention.    ED Results / Procedures / Treatments   Labs (all labs ordered are listed, but only abnormal results are displayed) Labs Reviewed  RESP PANEL BY RT-PCR (RSV, FLU A&B, COVID)  RVPGX2 - Abnormal; Notable for the following components:      Result Value   Influenza A by PCR POSITIVE (*)    All other components  within normal limits  BASIC METABOLIC PANEL - Abnormal; Notable for the following components:   Potassium 2.9 (*)    CO2 20 (*)    Glucose, Bld 131 (*)    All other components within normal limits  CBC  POC URINE PREG, ED  TROPONIN I (HIGH SENSITIVITY)     EKG  ED ECG REPORT I, Loleta Rose, the attending physician, personally viewed and interpreted this ECG.  Date: 01/14/2024 EKG Time: 01:06 Rate: 136 Rhythm: sinus tachycardia QRS Axis: normal Intervals: normal ST/T Wave abnormalities: normal Narrative Interpretation: no evidence of acute ischemia    RADIOLOGY I viewed and interpreted the patient's chest x-ray 2 view.  See hospital course for details.   PROCEDURES:  Critical Care performed: No  .1-3 Lead EKG Interpretation  Performed by: Loleta Rose, MD Authorized by: Loleta Rose, MD     Interpretation: abnormal     ECG rate:  130   ECG rate assessment: tachycardic     Rhythm: sinus tachycardia     Ectopy: none  Conduction: normal       IMPRESSION / MDM / ASSESSMENT AND PLAN / ED COURSE  I reviewed the triage vital signs and the nursing notes.                              Differential diagnosis includes, but is not limited to, asthma exacerbation, viral illness, community-acquired pneumonia, less likely PE  Patient's presentation is most consistent with acute presentation with potential threat to life or bodily function.  Labs/studies ordered: EKG, two-view chest x-ray, BMP, high-sensitivity troponin, respiratory viral panel, CBC  Interventions/Medications given:  Medications  albuterol (PROVENTIL) (2.5 MG/3ML) 0.083% nebulizer solution 2.5 mg (2.5 mg Nebulization Given 01/14/24 0210)  ipratropium-albuterol (DUONEB) 0.5-2.5 (3) MG/3ML nebulizer solution 3 mL (3 mLs Nebulization Given 01/14/24 0210)  ipratropium-albuterol (DUONEB) 0.5-2.5 (3) MG/3ML nebulizer solution 3 mL (3 mLs Nebulization Given 01/14/24 0210)  methylPREDNISolone sodium  succinate (SOLU-MEDROL) 125 mg/2 mL injection 125 mg (125 mg Intravenous Given 01/14/24 0210)  lactated ringers bolus 1,000 mL (1,000 mLs Intravenous New Bag/Given 01/14/24 0209)  potassium chloride (KLOR-CON) packet 40 mEq (40 mEq Oral Given 01/14/24 0326)    (Note:  hospital course my include additional interventions and/or labs/studies not listed above.)   Tachycardic and tachypneic with moderate respiratory distress, consistent with asthma exacerbation.  Lab work, imaging, and respiratory viral panel testing is pending.  I ordered a total of 3 DuoNebs and Solu-Medrol 125 mg IV.  Will consider magnesium if symptoms have not improved after breathing treatments.  Ordered 1 L LR IV bolus given insensible losses due to the tachypnea over the course of the day.  Will reassess.  The patient is on the cardiac monitor to evaluate for evidence of arrhythmia and/or significant heart rate changes.   Clinical Course as of 01/14/24 0326  Mon Jan 14, 2024  0155 DG Chest 2 View I viewed and interpreted the patient's two-view chest x-ray and I see no evidence of pneumonia. [CF]  0212 Influenza A By PCR(!): POSITIVE [CF]  0322 I reassessed the patient and she is resting quietly.  She is no longer in any respiratory distress and no longer using accessory muscles nor retracting.  Lab work is notable for mild hypokalemia but this is in large part due to potassium shift after albuterol.  I will give her a dose of potassium 40 mill equivalents by mouth to help supplement her potassium but this is not a critical issue.  Upon reauscultation, her lungs are now clear.  She is appropriate for discharge and outpatient follow-up.  She agrees with that plan.  Prescriptions as listed below.  I gave strict return precautions given her history of asthma and she agrees with the plan. [CF]  [id number] Of note, we discussed the risks and benefits of Tamiflu, and I explained why I do not recommend it.  She agrees that she does not want  a prescription for Tamiflu. [CF]    Clinical Course User Index [CF] Loleta Rose, MD     FINAL CLINICAL IMPRESSION(S) / ED DIAGNOSES   Final diagnoses:  Moderate persistent asthma with exacerbation  Influenza A     Rx / DC Orders   ED Discharge Orders          Ordered    predniSONE (DELTASONE) 10 MG tablet        01/14/24 0325    albuterol (VENTOLIN HFA) 108 (90 Base) MCG/ACT inhaler  Note to Pharmacy: Pharmacy may substitute brand and size for insurance-approved equivalent   01/14/24 0325    potassium chloride SA (KLOR-CON M20) 20 MEQ tablet  Daily        01/14/24 0325             Note:  This document was prepared using Dragon voice recognition software and may include unintentional dictation errors.   Loleta Rose, MD 01/14/24 1610    Loleta Rose, MD 01/14/24 [phone]

## 2024-01-14 NOTE — Discharge Instructions (Signed)
As we discussed, you tested positive for influenza, which is making her asthma worse.  Please continue to use your nebulizer and your inhaler as needed for wheezing and shortness of breath.  Please also take the full course of prednisone as prescribed.  In addition to those 2 prescriptions which we discussed, we also wrote a prescription for a potassium supplement because the persistent use of albuterol can make your potassium go lower than normal.  We recommend you follow-up with your regular doctor at the next available opportunity.  Return to the emergency department if you develop new or worsening symptoms that concern you.

## 2024-01-15 ENCOUNTER — Other Ambulatory Visit: Payer: Self-pay

## 2024-01-15 ENCOUNTER — Emergency Department
Admission: EM | Admit: 2024-01-15 | Discharge: 2024-01-15 | Discharge status: Against Medical Advice | Payer: MEDICAID | Attending: Emergency Medicine | Admitting: Emergency Medicine

## 2024-01-15 ENCOUNTER — Emergency Department: Payer: MEDICAID

## 2024-01-15 DIAGNOSIS — R0602 Shortness of breath: Secondary | ICD-10-CM | POA: Diagnosis present

## 2024-01-15 DIAGNOSIS — Z5321 Procedure and treatment not carried out due to patient leaving prior to being seen by health care provider: Secondary | ICD-10-CM | POA: Diagnosis not present

## 2024-01-15 DIAGNOSIS — R059 Cough, unspecified: Secondary | ICD-10-CM | POA: Insufficient documentation

## 2024-01-15 NOTE — ED Triage Notes (Signed)
Pt reports she was dx with the flu yesterday, pt reports worsening shortness of breath. Pt states she was given a breathing treatment yesterday that helped her sob. Pt states she took all prescribed medications.

## 2024-01-15 NOTE — ED Notes (Signed)
 No answer when called several times from lobby

## 2024-02-20 ENCOUNTER — Emergency Department
Admission: EM | Admit: 2024-02-20 | Discharge: 2024-02-20 | Disposition: A | Discharge status: Home / Self Care | Payer: MEDICAID | Attending: Emergency Medicine | Admitting: Emergency Medicine

## 2024-02-20 ENCOUNTER — Encounter: Payer: Self-pay | Admitting: Intensive Care

## 2024-02-20 ENCOUNTER — Emergency Department: Payer: MEDICAID

## 2024-02-20 ENCOUNTER — Other Ambulatory Visit: Payer: Self-pay

## 2024-02-20 DIAGNOSIS — J4541 Moderate persistent asthma with (acute) exacerbation: Secondary | ICD-10-CM | POA: Insufficient documentation

## 2024-02-20 DIAGNOSIS — R0689 Other abnormalities of breathing: Secondary | ICD-10-CM | POA: Diagnosis present

## 2024-02-20 MED ORDER — ALBUTEROL SULFATE HFA 108 (90 BASE) MCG/ACT IN AERS: 2.0000 | INHALATION_SPRAY | Freq: Four times a day (QID) | RESPIRATORY_TRACT | 0 refills | Status: DC | PRN

## 2024-02-20 MED ORDER — ALBUTEROL SULFATE (2.5 MG/3ML) 0.083% IN NEBU: 1.0000 mg | INHALATION_SOLUTION | RESPIRATORY_TRACT | 1 refills | Status: DC | PRN

## 2024-02-20 MED ORDER — PREDNISONE 20 MG PO TABS: 60.0000 mg | ORAL_TABLET | Freq: Every day | ORAL | 0 refills | Status: AC

## 2024-02-20 MED ORDER — IPRATROPIUM BROMIDE 0.02 % IN SOLN: 0.5000 mg | RESPIRATORY_TRACT | 1 refills | Status: AC | PRN

## 2024-02-20 MED ORDER — PREDNISONE 20 MG PO TABS
60.0000 mg | ORAL_TABLET | Freq: Once | ORAL | Status: AC
  Administered 2024-02-20: 60 mg via ORAL
  Filled 2024-02-20: qty 3

## 2024-02-20 MED ORDER — IPRATROPIUM-ALBUTEROL 0.5-2.5 (3) MG/3ML IN SOLN
3.0000 mL | Freq: Once | RESPIRATORY_TRACT | Status: AC
  Administered 2024-02-20: 3 mL via RESPIRATORY_TRACT
  Filled 2024-02-20: qty 3

## 2024-02-20 NOTE — ED Triage Notes (Signed)
 Patient reports her inhaler and albuterol machine are not relieving her to take a good deep breath. Worse when laying flat. Able to speak in full sentences in triage.  Dry Cough present

## 2024-02-20 NOTE — ED Provider Notes (Signed)
 [facility] Provider Note    Event Date/Time   First MD Initiated Contact with Patient 02/20/24 [phone]     (approximate)   History   Chief Complaint Asthma   HPI  Priscilla Cummings is a 23 y.o. female with past medical history of asthma and eczema who presents to the ED complaining of asthma.  Patient reports that she woke up this morning with significant difficulty breathing as well as a dry cough, which she states is worse when she goes to lay flat.  She tried a couple doses of her albuterol at home without significant relief.  She denies any fevers, does report feeling tight in her chest but denies overt chest pain.  She has not had any pain or swelling in her legs.  She does report being diagnosed with the flu a couple of weeks ago, although the symptoms have been improving.     Physical Exam   Triage Vital Signs: ED Triage Vitals  Encounter Vitals Group     BP 02/20/24 0855 98/68     Systolic BP Percentile --      Diastolic BP Percentile --      Pulse Rate 02/20/24 0855 (!) 113     Resp 02/20/24 0855 20     Temp 02/20/24 0855 98 F (36.7 C)     Temp Source 02/20/24 0855 Oral     SpO2 02/20/24 0855 99 %     Weight 02/20/24 0856 125 lb (56.7 kg)     Height 02/20/24 0856 5\' 3"  (1.6 m)     Head Circumference --      Peak Flow --      Pain Score 02/20/24 0856 5     Pain Loc --      Pain Education --      Exclude from Growth Chart --     Most recent vital signs: Vitals:   02/20/24 0855  BP: 98/68  Pulse: (!) 113  Resp: 20  Temp: 98 F (36.7 C)  SpO2: 99%    Constitutional: Alert and oriented. Eyes: Conjunctivae are normal. Head: Atraumatic. Nose: No congestion/rhinnorhea. Mouth/Throat: Mucous membranes are moist.  Cardiovascular: Tachycardic, regular rhythm. Grossly normal heart sounds.  2+ radial pulses bilaterally. Respiratory: Normal respiratory effort.  No retractions. Lungs with inspiratory and expiratory  wheezing. Gastrointestinal: Soft and nontender. No distention. Musculoskeletal: No lower extremity tenderness nor edema.  Neurologic:  Normal speech and language. No gross focal neurologic deficits are appreciated.    ED Results / Procedures / Treatments   Labs (all labs ordered are listed, but only abnormal results are displayed) Labs Reviewed - No data to display   EKG  ED ECG REPORT I, Chesley Noon, the attending physician, personally viewed and interpreted this ECG.   Date: 02/20/2024  EKG Time: 9:24  Rate: 96  Rhythm: normal sinus rhythm  Axis: Normal  Intervals:none  ST&T Change: None  RADIOLOGY Chest x-ray reviewed and interpreted by me with no infiltrate, edema, or effusion.  PROCEDURES:  Critical Care performed: No  Procedures   MEDICATIONS ORDERED IN ED: Medications  ipratropium-albuterol (DUONEB) 0.5-2.5 (3) MG/3ML nebulizer solution 3 mL (3 mLs Nebulization Given 02/20/24 0916)  predniSONE (DELTASONE) tablet 60 mg (60 mg Oral Given 02/20/24 0916)     IMPRESSION / MDM / ASSESSMENT AND PLAN / ED COURSE  I reviewed the triage vital signs and the nursing notes.  23 y.o. female with past medical history of asthma and eczema who presents to the ED complaining of dry cough with increasing difficulty breathing starting this morning.  Patient's presentation is most consistent with acute presentation with potential threat to life or bodily function.  Differential diagnosis includes, but is not limited to, ACS, PE, pneumonia, pneumothorax, asthma exacerbation.  Patient nontoxic-appearing and in no acute distress, vital signs remarkable for tachycardia but otherwise reassuring.  Patient is not in any respiratory distress and maintaining oxygen saturations at 99% on room air.  She does have inspiratory and expiratory wheezing on exam, will treat with prednisone and DuoNeb.  EKG without evidence of arrhythmia or ischemia, low suspicion  for ACS or PE at this time.  Chest x-ray is pending.  Chest x-ray is unremarkable, patient feeling better on reassessment with significant improvement in wheezing.  She is appropriate for discharge home with outpatient follow-up, was counseled to return to the ED for new or worsening symptoms.  Patient agrees with plan.      FINAL CLINICAL IMPRESSION(S) / ED DIAGNOSES   Final diagnoses:  Moderate persistent asthma with exacerbation     Rx / DC Orders   ED Discharge Orders          Ordered    albuterol (VENTOLIN HFA) 108 (90 Base) MCG/ACT inhaler  Every 6 hours PRN       Note to Pharmacy: Please supply with spacer   02/20/24 1051    predniSONE (DELTASONE) 20 MG tablet  Daily with breakfast        02/20/24 1051    albuterol (PROVENTIL) (2.5 MG/3ML) 0.083% nebulizer solution  Every 4 hours PRN        02/20/24 1051    ipratropium (ATROVENT) 0.02 % nebulizer solution  Every 4 hours PRN        02/20/24 1051             Note:  This document was prepared using Dragon voice recognition software and may include unintentional dictation errors.   Chesley Noon, MD 02/20/24 1052

## 2024-03-06 ENCOUNTER — Encounter: Payer: Self-pay | Admitting: Intensive Care

## 2024-03-06 ENCOUNTER — Other Ambulatory Visit: Payer: Self-pay

## 2024-03-06 ENCOUNTER — Emergency Department
Admission: EM | Admit: 2024-03-06 | Discharge: 2024-03-06 | Disposition: A | Discharge status: Home / Self Care | Payer: MEDICAID | Attending: Emergency Medicine | Admitting: Emergency Medicine

## 2024-03-06 ENCOUNTER — Emergency Department: Payer: MEDICAID

## 2024-03-06 DIAGNOSIS — J45901 Unspecified asthma with (acute) exacerbation: Secondary | ICD-10-CM | POA: Diagnosis not present

## 2024-03-06 DIAGNOSIS — J4541 Moderate persistent asthma with (acute) exacerbation: Secondary | ICD-10-CM

## 2024-03-06 DIAGNOSIS — R0602 Shortness of breath: Secondary | ICD-10-CM | POA: Diagnosis present

## 2024-03-06 LAB — CBC WITH DIFFERENTIAL/PLATELET
Abs Immature Granulocytes: 0.07 10*3/uL (ref 0.00–0.07)
Basophils Absolute: 0.1 10*3/uL (ref 0.0–0.1)
Basophils Relative: 1 %
Eosinophils Absolute: 1.1 10*3/uL — ABNORMAL HIGH (ref 0.0–0.5)
Eosinophils Relative: 11 %
HCT: 45 % (ref 36.0–46.0)
Hemoglobin: 15.5 g/dL — ABNORMAL HIGH (ref 12.0–15.0)
Immature Granulocytes: 1 %
Lymphocytes Relative: 19 %
Lymphs Abs: 1.9 10*3/uL (ref 0.7–4.0)
MCH: 33.3 pg (ref 26.0–34.0)
MCHC: 34.4 g/dL (ref 30.0–36.0)
MCV: 96.6 fL (ref 80.0–100.0)
Monocytes Absolute: 0.7 10*3/uL (ref 0.1–1.0)
Monocytes Relative: 7 %
Neutro Abs: 6.3 10*3/uL (ref 1.7–7.7)
Neutrophils Relative %: 61 %
Platelets: 214 10*3/uL (ref 150–400)
RBC: 4.66 MIL/uL (ref 3.87–5.11)
RDW: 11.9 % (ref 11.5–15.5)
WBC: 10.1 10*3/uL (ref 4.0–10.5)
nRBC: 0 % (ref 0.0–0.2)

## 2024-03-06 LAB — COMPREHENSIVE METABOLIC PANEL WITH GFR
ALT: 29 U/L (ref 0–44)
AST: 30 U/L (ref 15–41)
Albumin: 4.1 g/dL (ref 3.5–5.0)
Alkaline Phosphatase: 40 U/L (ref 38–126)
Anion gap: 8 (ref 5–15)
BUN: 14 mg/dL (ref 6–20)
CO2: 22 mmol/L (ref 22–32)
Calcium: 8.9 mg/dL (ref 8.9–10.3)
Chloride: 107 mmol/L (ref 98–111)
Creatinine, Ser: 0.59 mg/dL (ref 0.44–1.00)
GFR, Estimated: >60 mL/min (ref >60)
Glucose, Bld: 113 mg/dL — ABNORMAL HIGH (ref 70–99)
Potassium: 3.6 mmol/L (ref 3.5–5.1)
Sodium: 137 mmol/L (ref 135–145)
Total Bilirubin: 0.7 mg/dL (ref 0.0–1.2)
Total Protein: 7.2 g/dL (ref 6.5–8.1)

## 2024-03-06 LAB — TROPONIN I (HIGH SENSITIVITY): Troponin I (High Sensitivity): <2 ng/L (ref <18)

## 2024-03-06 MED ORDER — IPRATROPIUM-ALBUTEROL 0.5-2.5 (3) MG/3ML IN SOLN
3.0000 mL | Freq: Once | RESPIRATORY_TRACT | Status: AC
  Administered 2024-03-06: 3 mL via RESPIRATORY_TRACT
  Filled 2024-03-06: qty 3

## 2024-03-06 MED ORDER — ALBUTEROL SULFATE HFA 108 (90 BASE) MCG/ACT IN AERS: 2.0000 | INHALATION_SPRAY | Freq: Four times a day (QID) | RESPIRATORY_TRACT | 0 refills | Status: DC | PRN

## 2024-03-06 MED ORDER — PREDNISONE 10 MG PO TABS: 50.0000 mg | ORAL_TABLET | Freq: Every day | ORAL | 0 refills | Status: DC

## 2024-03-06 MED ORDER — MONTELUKAST SODIUM 10 MG PO TABS: 10.0000 mg | ORAL_TABLET | Freq: Every day | ORAL | 2 refills | Status: AC

## 2024-03-06 NOTE — ED Triage Notes (Signed)
 C/o sob with any activity and worsening when lying flat. Seen for same on 02/20/24. Reports inhalers she was given then are not helping.   Reports pressure in abdomen and tightness in chest  Reports taking her prednisone dose prescribed and breathing got worse again after finishing medicine

## 2024-03-06 NOTE — ED Notes (Signed)
 See triage note  Presents with some diff breathing.States she thinks her asthma has been acting up over the past 2-3 days  No fever  Min relief with inhaler use

## 2024-03-06 NOTE — ED Provider Notes (Signed)
 [facility] Provider Note    Event Date/Time   First MD Initiated Contact with Patient 03/06/24 1322     (approximate)   History   Shortness of Breath   HPI  Priscilla Cummings is a 23 y.o. female with history of asthma, eczema, and as listed in EMR presents to the emergency department for treatment and evaluation of shortness of breath with exertion that seems to be worse when she lays flat. She has completed the prednisone prescribed at her last visit here 02/20/24. Symptoms seem to have gradually escalated since having the flu a few weeks ago.  She has been wheezing this morning. No relief with albuterol and benadryl. No fever. Chest feels tight as with previous asthma exacerbations and abdomen is sore from coughing.      Physical Exam   Triage Vital Signs: ED Triage Vitals  Encounter Vitals Group     BP 03/06/24 1215 117/80     Systolic BP Percentile --      Diastolic BP Percentile --      Pulse Rate 03/06/24 1215 (!) 107     Resp 03/06/24 1215 20     Temp 03/06/24 1215 98.5 F (36.9 C)     Temp Source 03/06/24 1215 Oral     SpO2 03/06/24 1215 100 %     Weight 03/06/24 1210 119 lb (54 kg)     Height 03/06/24 1210 5\' 3"  (1.6 m)     Head Circumference --      Peak Flow --      Pain Score 03/06/24 1210 0     Pain Loc --      Pain Education --      Exclude from Growth Chart --     Most recent vital signs: Vitals:   03/06/24 1215  BP: 117/80  Pulse: (!) 107  Resp: 20  Temp: 98.5 F (36.9 C)  SpO2: 100%    General: Awake, no distress.  CV:  Good peripheral perfusion.  Resp:  Normal effort. Faint expiratory wheezing bilateral bases. Abd:  No distention.  Other:     ED Results / Procedures / Treatments   Labs (all labs ordered are listed, but only abnormal results are displayed) Labs Reviewed  CBC WITH DIFFERENTIAL/PLATELET - Abnormal; Notable for the following components:      Result Value   Hemoglobin 15.5 (*)    Eosinophils  Absolute 1.1 (*)    All other components within normal limits  COMPREHENSIVE METABOLIC PANEL WITH GFR - Abnormal; Notable for the following components:   Glucose, Bld 113 (*)    All other components within normal limits  TROPONIN I (HIGH SENSITIVITY)     EKG  NSR, with sinus arrhythmia, rate of 99   RADIOLOGY  Image and radiology report reviewed and interpreted by me. Radiology report consistent with the same.  No acute cardiopulmonary abnormality noted.  PROCEDURES:  Critical Care performed: No  Procedures   MEDICATIONS ORDERED IN ED:  Medications  ipratropium-albuterol (DUONEB) 0.5-2.5 (3) MG/3ML nebulizer solution 3 mL (3 mLs Nebulization Given 03/06/24 1353)     IMPRESSION / MDM / ASSESSMENT AND PLAN / ED COURSE   I have reviewed the triage note.  Differential diagnosis includes, but is not limited to, asthma exacerbation, cardiac COVID, pneumonia  Patient's presentation is most consistent with acute illness / injury with system symptoms.  23 year old female who presents to the emergency department for treatment and evaluation of persistent asthma flare.  She reports that she was here a few weeks ago and was treated with prednisone and Ventolin which helped but symptoms have returned.  She states that she has not felt completely back to normal since having the flu.  On exam she does have wheezing in bilateral bases.  DuoNeb ordered.  Lab studies are all reassuring.  Her troponin is less than 2.  Chest x-ray is negative for acute concerns.  Initial heart rate in triage was 107 however that is now down to 89 according to bedside monitor.  Oxygen saturation is 98% on room air.  Plan will be to restart a burst dose of prednisone, refill the Ventolin and add Singulair.  At this point I feel that she would benefit from a pulmonology consult.  Follow-up information to be provided on her discharge papers.  ER and outpatient follow-up discussed.      FINAL CLINICAL  IMPRESSION(S) / ED DIAGNOSES   Final diagnoses:  Moderate persistent asthma with exacerbation     Rx / DC Orders   ED Discharge Orders          Ordered    albuterol (VENTOLIN HFA) 108 (90 Base) MCG/ACT inhaler  Every 6 hours PRN       Note to Pharmacy: Please supply with spacer   03/06/24 1430    predniSONE (DELTASONE) 10 MG tablet  Daily        03/06/24 1430    montelukast (SINGULAIR) 10 MG tablet  Daily        03/06/24 1430             Note:  This document was prepared using Dragon voice recognition software and may include unintentional dictation errors.   Chinita Pester, FNP 03/06/24 1920    Concha Se, MD 03/07/24 [phone]

## 2024-03-06 NOTE — Discharge Instructions (Signed)
 Please call and schedule follow-up appointment with the pulmonologist.  Use your inhalers as prescribed for cough, chest tightness, or wheezing.  See primary care, pulmonology, or return to the emergency department if symptoms change or worsen.

## 2024-04-03 ENCOUNTER — Other Ambulatory Visit: Payer: Self-pay

## 2024-04-03 ENCOUNTER — Emergency Department
Admission: EM | Admit: 2024-04-03 | Discharge: 2024-04-03 | Disposition: A | Discharge status: Home / Self Care | Payer: MEDICAID | Attending: Emergency Medicine | Admitting: Emergency Medicine

## 2024-04-03 DIAGNOSIS — N760 Acute vaginitis: Secondary | ICD-10-CM | POA: Diagnosis not present

## 2024-04-03 DIAGNOSIS — R109 Unspecified abdominal pain: Secondary | ICD-10-CM | POA: Diagnosis present

## 2024-04-03 LAB — CBC
HCT: 45.8 % (ref 36.0–46.0)
Hemoglobin: 15.4 g/dL — ABNORMAL HIGH (ref 12.0–15.0)
MCH: 32.6 pg (ref 26.0–34.0)
MCHC: 33.6 g/dL (ref 30.0–36.0)
MCV: 97 fL (ref 80.0–100.0)
Platelets: 249 10*3/uL (ref 150–400)
RBC: 4.72 MIL/uL (ref 3.87–5.11)
RDW: 11.8 % (ref 11.5–15.5)
WBC: 9.9 10*3/uL (ref 4.0–10.5)
nRBC: 0 % (ref 0.0–0.2)

## 2024-04-03 LAB — URINALYSIS, ROUTINE W REFLEX MICROSCOPIC
Bilirubin Urine: NEGATIVE
Glucose, UA: NEGATIVE mg/dL
Ketones, ur: NEGATIVE mg/dL
Leukocytes,Ua: NEGATIVE
Nitrite: NEGATIVE
Protein, ur: NEGATIVE mg/dL
Specific Gravity, Urine: 1.024 (ref 1.005–1.030)
pH: 7 (ref 5.0–8.0)

## 2024-04-03 LAB — WET PREP, GENITAL
Sperm: NONE SEEN
Trich, Wet Prep: NONE SEEN
WBC, Wet Prep HPF POC: <10 (ref <10)
Yeast Wet Prep HPF POC: NONE SEEN

## 2024-04-03 LAB — POC URINE PREG, ED: Preg Test, Ur: NEGATIVE

## 2024-04-03 LAB — BASIC METABOLIC PANEL WITH GFR
Anion gap: 10 (ref 5–15)
BUN: 15 mg/dL (ref 6–20)
CO2: 23 mmol/L (ref 22–32)
Calcium: 9.3 mg/dL (ref 8.9–10.3)
Chloride: 104 mmol/L (ref 98–111)
Creatinine, Ser: 0.69 mg/dL (ref 0.44–1.00)
GFR, Estimated: >60 mL/min (ref >60)
Glucose, Bld: 92 mg/dL (ref 70–99)
Potassium: 3.5 mmol/L (ref 3.5–5.1)
Sodium: 137 mmol/L (ref 135–145)

## 2024-04-03 MED ORDER — METRONIDAZOLE 500 MG PO TABS: 500.0000 mg | ORAL_TABLET | Freq: Two times a day (BID) | ORAL | 0 refills | Status: AC

## 2024-04-03 NOTE — ED Provider Notes (Signed)
 [facility] Provider Note    Event Date/Time   First MD Initiated Contact with Patient 04/03/24 2135     (approximate)   History   Flank Pain   HPI  Priscilla Cummings is a 23 y.o. female who presents to the emergency department today because of concerns for feeling not right, bilateral flank pain, back pain, suprapubic pain and abnormal menstrual discharge.  She states that she first thought she might be coming down with a UTI about 4 to 5 days ago.  Did try taking cranberry juice and hydrating.  She thought that this was helping but then she developed the other symptoms.  She states that she has felt this way before when she has had UTIs or BV. Denies any fevers.      Physical Exam   Triage Vital Signs: ED Triage Vitals  Encounter Vitals Group     BP 04/03/24 2044 108/76     Systolic BP Percentile --      Diastolic BP Percentile --      Pulse Rate 04/03/24 2044 88     Resp 04/03/24 2044 18     Temp 04/03/24 2044 98.3 F (36.8 C)     Temp Source 04/03/24 2044 Oral     SpO2 04/03/24 2044 100 %     Weight --      Height --      Head Circumference --      Peak Flow --      Pain Score 04/03/24 2047 5     Pain Loc --      Pain Education --      Exclude from Growth Chart --     Most recent vital signs: Vitals:   04/03/24 2044  BP: 108/76  Pulse: 88  Resp: 18  Temp: 98.3 F (36.8 C)  SpO2: 100%   General: Awake, alert, oriented. CV:  Good peripheral perfusion. Regular rate and rhythm. Resp:  Normal effort. Lungs clear. Abd:  No distention. Tender to palpation in the suprapubic region. Other:  Tender across most of her back, upper and lower.    ED Results / Procedures / Treatments   Labs (all labs ordered are listed, but only abnormal results are displayed) Labs Reviewed  URINALYSIS, ROUTINE W REFLEX MICROSCOPIC - Abnormal; Notable for the following components:      Result Value   Color, Urine YELLOW (*)    APPearance CLOUDY (*)     Hgb urine dipstick SMALL (*)    Bacteria, UA RARE (*)    All other components within normal limits  CBC - Abnormal; Notable for the following components:   Hemoglobin 15.4 (*)    All other components within normal limits  BASIC METABOLIC PANEL WITH GFR  POC URINE PREG, ED     EKG  None   RADIOLOGY None   PROCEDURES:  Critical Care performed: No   MEDICATIONS ORDERED IN ED: Medications - No data to display   IMPRESSION / MDM / ASSESSMENT AND PLAN / ED COURSE  I reviewed the triage vital signs and the nursing notes.                              Differential diagnosis includes, but is not limited to, UTI, BV, yeast infection  Patient's presentation is most consistent with acute presentation with potential threat to life or bodily function.   Patient presented to the emergency department today  because of concern for possible UTI or BV.  Patient states that she has been having diffuse back pain, suprapubic.  Abnormal menstrual bleeding.  On exam she is slightly tender across her whole back.  She does state that this could be secondary to her occupation.  No significant abdominal tenderness.  UA without elevated WBCs, leukocytes, or nitrite. Wet prep positive for clue cells. Will treat for BV. Discussed findings and plan with patient.    FINAL CLINICAL IMPRESSION(S) / ED DIAGNOSES   Final diagnoses:  BV (bacterial vaginosis)        Rx / DC Orders     Note:  This document was prepared using Dragon voice recognition software and may include unintentional dictation errors.    Marylynn Soho, MD 04/03/24 [phone]

## 2024-04-03 NOTE — ED Notes (Addendum)
 Pt states she has been having lower back pain for a few days and discharge with malodor. Pt states she has a known hx of UTI and BV. States it feels like its happening again.

## 2024-04-03 NOTE — ED Triage Notes (Signed)
 Patient C/O bilateral flank pain, malodorous urine, and urinary frequency that began around three days ago. Patient denies nausea, vomiting, or fevers.

## 2024-04-04 LAB — CHLAMYDIA/NGC RT PCR (ARMC ONLY)
Chlamydia Tr: NOT DETECTED
N gonorrhoeae: NOT DETECTED

## 2024-05-05 ENCOUNTER — Ambulatory Visit: Payer: MEDICAID

## 2024-05-05 ENCOUNTER — Emergency Department
Admission: EM | Admit: 2024-05-05 | Discharge: 2024-05-05 | Disposition: A | Discharge status: Home / Self Care | Payer: MEDICAID | Attending: Emergency Medicine | Admitting: Emergency Medicine

## 2024-05-05 ENCOUNTER — Other Ambulatory Visit: Payer: Self-pay

## 2024-05-05 DIAGNOSIS — L309 Dermatitis, unspecified: Secondary | ICD-10-CM | POA: Diagnosis not present

## 2024-05-05 DIAGNOSIS — J45909 Unspecified asthma, uncomplicated: Secondary | ICD-10-CM | POA: Diagnosis not present

## 2024-05-05 MED ORDER — PIMECROLIMUS 1 % EX CREA: TOPICAL_CREAM | CUTANEOUS | 1 refills | Status: AC

## 2024-05-05 MED ORDER — TRIAMCINOLONE ACETONIDE 0.1 % EX CREA
1.0000 | TOPICAL_CREAM | Freq: Two times a day (BID) | CUTANEOUS | 0 refills | Status: AC
Start: 2024-05-05 — End: ?

## 2024-05-05 NOTE — ED Triage Notes (Signed)
 Pt reports she developed a rash around her eyes a few days ago, pt reports she works at a [employer] and she constantly gets dust and powder in her face. Pt reports when she blinks she feels like she has blisters on her eyelids. Pt has hx eczema. Dry red and flaky rash to around eyes, pt denies vision changes.

## 2024-05-05 NOTE — ED Provider Notes (Signed)
 [facility] Provider Note    Event Date/Time   First MD Initiated Contact with Patient 05/05/24 2123     (approximate)   History   Rash   HPI  Priscilla Cummings is a 23 y.o. female with PMH of asthma and eczema who presents for evaluation of an eczema flare with spread to her face around her eyes.  Patient states that it has been very red and flaky around her eyes.  She has not been able to get in to see a primary care provider.      Physical Exam   Triage Vital Signs: ED Triage Vitals  Encounter Vitals Group     BP 05/05/24 1946 (!) 143/89     Systolic BP Percentile --      Diastolic BP Percentile --      Pulse Rate 05/05/24 1946 (!) 110     Resp 05/05/24 1946 18     Temp 05/05/24 1946 98.5 F (36.9 C)     Temp src --      SpO2 05/05/24 1946 100 %     Weight 05/05/24 1945 119 lb (54 kg)     Height 05/05/24 1945 5\' 3"  (1.6 m)     Head Circumference --      Peak Flow --      Pain Score 05/05/24 1945 4     Pain Loc --      Pain Education --      Exclude from Growth Chart --     Most recent vital signs: Vitals:   05/05/24 1946  BP: (!) 143/89  Pulse: (!) 110  Resp: 18  Temp: 98.5 F (36.9 C)  SpO2: 100%    General: Awake, no distress.  CV:  Good peripheral perfusion.  Resp:  Normal effort.  Abd:  No distention.  Other:  Erythematous rash with overlying dry flakes around the bilateral eyes, neck, flexural surface of the arms and legs.  PERRL, EOM intact, sclera noninjected.   ED Results / Procedures / Treatments   Labs (all labs ordered are listed, but only abnormal results are displayed) Labs Reviewed - No data to display   PROCEDURES:  Critical Care performed: No  Procedures   MEDICATIONS ORDERED IN ED: Medications - No data to display   IMPRESSION / MDM / ASSESSMENT AND PLAN / ED COURSE  I reviewed the triage vital signs and the nursing notes.                             23 year old female presents for  evaluation of a rash.  Blood pressure is elevated and patient was tachycardic otherwise vital signs are stable.  Patient quite anxious on exam.  Differential diagnosis includes, but is not limited to, eczema, contact dermatitis, blepharitis.  Patient's presentation is most consistent with acute, uncomplicated illness.  Patient's presentation is consistent with eczema.  I sent pimecrolimus 1% cream to be used on the face and triamcinolone cream to be used on the body.  She was given information for dermatology follow-up as well as primary care offices in the area.  Patient did not need a note for work.  She voiced understanding, all questions were answered and she was stable at discharge.      FINAL CLINICAL IMPRESSION(S) / ED DIAGNOSES   Final diagnoses:  Eczema, unspecified type     Rx / DC Orders   ED Discharge Orders  Ordered    pimecrolimus (ELIDEL) 1 % cream        05/05/24 2200    triamcinolone cream (KENALOG) 0.1 %  2 times daily        05/05/24 2200             Note:  This document was prepared using Dragon voice recognition software and may include unintentional dictation errors.   Phyliss Breen, PA-C 05/05/24 2205    Kandee Orion, MD 05/05/24 [phone]

## 2024-05-05 NOTE — Discharge Instructions (Addendum)
 I believe the rash is a flare of your eczema. You can use the pimecrolimus 1% cream on your eyelids and face. This can be applied twice daily.  The triamcinolone cream you can apply to your body.  This is too strong of a steroid to put on your face, use on your body only.  Please go to the following website to schedule new (and existing) patient appointments:   [email or web address]   The following is a list of primary care offices in the area who are accepting new patients at this time.  Please reach out to one of them directly and let them know you would like to schedule an appointment to follow up on an Emergency Department visit, and/or to establish a new primary care provider (PCP).  There are likely other primary care clinics in the are who are accepting new patients, but this is an excellent place to start:  [facility] Lead physician: Dr Aden Agreste [address] [place], [place] [address] [phone]  [facility] Lead Physician: Dr Arleen Lacer [address], [place], [place] [address] [phone]  [facility]  Lead Physician: Dr Terre Ferri [address] [place], [place] [address] [phone]  [facility] Lead Physician: Dr Audrie Blind [address], [place], [place] [address] [phone]  [facility] at [facility] Lead Physician: Dr Janna Melter [address] [place], [place], [place] [address] [phone]

## 2024-05-09 ENCOUNTER — Telehealth: Payer: Self-pay | Admitting: Emergency Medicine

## 2024-05-09 NOTE — Telephone Encounter (Signed)
  Call from pharmacy that her insurance did not cover pimecrolimus  (ELIDEL ) 1 % cream.  The pharmacist recommended doing tacrolimus 0.1 as this is a very low-dose similar medication that can also be used on the face.  This was switched with the pharmacist.

## 2024-05-15 ENCOUNTER — Emergency Department
Admission: EM | Admit: 2024-05-15 | Discharge: 2024-05-15 | Disposition: A | Discharge status: Home / Self Care | Payer: MEDICAID | Attending: Emergency Medicine | Admitting: Emergency Medicine

## 2024-05-15 ENCOUNTER — Emergency Department: Payer: MEDICAID

## 2024-05-15 ENCOUNTER — Other Ambulatory Visit: Payer: Self-pay

## 2024-05-15 DIAGNOSIS — J4521 Mild intermittent asthma with (acute) exacerbation: Secondary | ICD-10-CM

## 2024-05-15 DIAGNOSIS — N3001 Acute cystitis with hematuria: Secondary | ICD-10-CM

## 2024-05-15 DIAGNOSIS — R059 Cough, unspecified: Secondary | ICD-10-CM | POA: Diagnosis present

## 2024-05-15 DIAGNOSIS — R062 Wheezing: Secondary | ICD-10-CM

## 2024-05-15 LAB — URINALYSIS, ROUTINE W REFLEX MICROSCOPIC
Bilirubin Urine: NEGATIVE
Glucose, UA: NEGATIVE mg/dL
Ketones, ur: 20 mg/dL — AB
Nitrite: NEGATIVE
Protein, ur: 100 mg/dL — AB
Specific Gravity, Urine: 1.024 (ref 1.005–1.030)
WBC, UA: >50 WBC/hpf (ref 0–5)
pH: 5 (ref 5.0–8.0)

## 2024-05-15 MED ORDER — NITROFURANTOIN MONOHYD MACRO 100 MG PO CAPS
100.0000 mg | ORAL_CAPSULE | Freq: Once | ORAL | Status: AC
  Administered 2024-05-15: 100 mg via ORAL
  Filled 2024-05-15: qty 1

## 2024-05-15 MED ORDER — IPRATROPIUM-ALBUTEROL 0.5-2.5 (3) MG/3ML IN SOLN
3.0000 mL | Freq: Once | RESPIRATORY_TRACT | Status: AC
Start: 2024-05-15 — End: 2024-05-15
  Administered 2024-05-15: 3 mL via RESPIRATORY_TRACT
  Filled 2024-05-15: qty 3

## 2024-05-15 MED ORDER — ALBUTEROL SULFATE HFA 108 (90 BASE) MCG/ACT IN AERS: 2.0000 | INHALATION_SPRAY | Freq: Four times a day (QID) | RESPIRATORY_TRACT | 2 refills | Status: AC | PRN

## 2024-05-15 MED ORDER — PREDNISONE 20 MG PO TABS: 40.0000 mg | ORAL_TABLET | Freq: Every day | ORAL | 0 refills | Status: AC

## 2024-05-15 MED ORDER — METHYLPREDNISOLONE SODIUM SUCC 125 MG IJ SOLR
80.0000 mg | Freq: Once | INTRAMUSCULAR | Status: AC
  Administered 2024-05-15: 80 mg via INTRAMUSCULAR
  Filled 2024-05-15: qty 2

## 2024-05-15 MED ORDER — NITROFURANTOIN MONOHYD MACRO 100 MG PO CAPS: 100.0000 mg | ORAL_CAPSULE | Freq: Two times a day (BID) | ORAL | 0 refills | Status: AC

## 2024-05-15 NOTE — ED Triage Notes (Addendum)
 Patient C/O SOB/wheezing/cough that began a week ago. O2 saturation 100% in triage, no wheezing appreciated during auscultation. Patient does have asthma and states that she has an inhaler at home, but it is an old prescription and is not working. She has not been able to establish a PCP to fill her prescriptions. Additionally, she is C/O urinary frequency and burning.

## 2024-05-15 NOTE — Discharge Instructions (Signed)
 Please take medications as prescribed and return to ER for any worsening symptoms or any urgent changes in your health such as fevers, nausea vomiting back pain, chest pain shortness of breath

## 2024-05-15 NOTE — ED Provider Notes (Cosign Needed)
 [place] EMERGENCY DEPARTMENT AT [facility] Provider Note   CSN: [id number] Arrival date & time: 05/15/24  2031     Patient presents with: Shortness of Breath and Urinary Frequency   Priscilla Cummings is a 23 y.o. female chronic asthma presents to the emergency department for evaluation of wheezing, cough.  Patient states she ran out of her albuterol  inhaler.  She has been having wheezing, chest tightness over the last week.  She denies any fevers.  Her cough is dry nonproductive.  Patient states she has not seen her PCP in a while so they will not refill her medication until she becomes established again.  Patient has also had some urinary frequency and burning over the last few days.  She denies any back pain nausea vomiting abdominal pain or fevers.  She has a history of UTIs.  Previous urine cultures show sensitivity to all medications with no resistance.   With history of       Prior to Admission medications   Medication Sig Start Date End Date Taking? Authorizing Provider  albuterol  (VENTOLIN  HFA) 108 (90 Base) MCG/ACT inhaler Inhale 2 puffs into the lungs every 6 (six) hours as needed for wheezing or shortness of breath. 05/15/24  Yes Coralyn Derry, PA-C  nitrofurantoin , macrocrystal-monohydrate, (MACROBID ) 100 MG capsule Take 1 capsule (100 mg total) by mouth 2 (two) times daily for 5 days. 05/15/24 05/20/24 Yes Coralyn Derry, PA-C  predniSONE  (DELTASONE ) 20 MG tablet Take 2 tablets (40 mg total) by mouth daily with breakfast for 5 days. 05/15/24 05/20/24 Yes Coralyn Derry, PA-C  diphenhydrAMINE  (BENADRYL ) 50 MG tablet Take 0.5 tablets (25 mg total) by mouth every 8 (eight) hours as needed for itching or allergies. 05/31/21   Patel, Sona, MD  EPINEPHrine  0.3 mg/0.3 mL IJ SOAJ injection Inject 0.3 mg into the muscle once as needed (allergic reaction). 06/01/21   Patel, Sona, MD  famotidine  (PEPCID ) 20 MG tablet Take 1 tablet (20 mg total) by mouth 2 (two) times daily.  05/31/21 05/31/22  Patel, Sona, MD  ipratropium (ATROVENT ) 0.02 % nebulizer solution Take 2.5 mLs (0.5 mg total) by nebulization every 4 (four) hours as needed for wheezing or shortness of breath. 02/20/24   Twilla Galea, MD  levonorgestrel  (MIRENA ) 20 MCG/24HR IUD 1 each by Intrauterine route once. 06/15/20   [provider]  levonorgestrel  (MIRENA ) 20 MCG/DAY IUD 1 each by Intrauterine route once.    [provider]  montelukast  (SINGULAIR ) 10 MG tablet Take 1 tablet (10 mg total) by mouth daily. 03/06/24 03/06/25  Triplett, Davene Ernst B, FNP  phenazopyridine  (PYRIDIUM ) 100 MG tablet Take 1 tablet (100 mg total) by mouth 3 (three) times daily as needed for pain. 07/20/23   Shann Darnel Rufino Coulter, PA-C  pimecrolimus  (ELIDEL ) 1 % cream Apply to affected area 2 times daily 05/05/24 05/05/25  Phyliss Breen, PA-C  potassium chloride  SA (KLOR-CON  M20) 20 MEQ tablet Take 1 tablet (20 mEq total) by mouth daily. 01/14/24   Lynnda Sas, MD  triamcinolone  cream (KENALOG ) 0.1 % Apply 1 Application topically 2 (two) times daily. 05/05/24   Phyliss Breen, PA-C    Allergies: Pineapple, Shellfish allergy, Pineapple, and Shellfish allergy    Review of Systems  Updated Vital Signs BP (!) 115/99   Pulse (!) 108   Temp 98.2 F (36.8 C) (Oral)   Resp 20   Ht 5' 3 (1.6 m)   Wt 56 kg   SpO2 100%   BMI 21.87  kg/m   Physical Exam Constitutional:      Appearance: She is well-developed.  HENT:     Head: Normocephalic and atraumatic.     Mouth/Throat:     Pharynx: No oropharyngeal exudate or posterior oropharyngeal erythema.   Eyes:     Conjunctiva/sclera: Conjunctivae normal.    Cardiovascular:     Rate and Rhythm: Normal rate and regular rhythm.  Pulmonary:     Effort: Pulmonary effort is normal. No respiratory distress.     Breath sounds: Wheezing present.  Abdominal:     General: There is no distension.     Tenderness: There is no abdominal tenderness. There is no right CVA tenderness,  left CVA tenderness or guarding.   Musculoskeletal:        General: Normal range of motion.     Cervical back: Normal range of motion.   Skin:    General: Skin is warm.     Findings: No rash.   Neurological:     General: No focal deficit present.     Mental Status: She is alert and oriented to person, place, and time.   Psychiatric:        Behavior: Behavior normal.        Thought Content: Thought content normal.     (all labs ordered are listed, but only abnormal results are displayed) Labs Reviewed  URINALYSIS, ROUTINE W REFLEX MICROSCOPIC - Abnormal; Notable for the following components:      Result Value   Color, Urine YELLOW (*)    APPearance CLOUDY (*)    Hgb urine dipstick MODERATE (*)    Ketones, ur 20 (*)    Protein, ur 100 (*)    Leukocytes,Ua SMALL (*)    Bacteria, UA RARE (*)    All other components within normal limits  POC URINE PREG, ED    EKG: None  Radiology: DG Chest 1 View Result Date: 05/15/2024 CLINICAL DATA:  Cough, SOB EXAM: CHEST  1 VIEW COMPARISON:  Chest x-ray 03/06/2024 FINDINGS: The heart and mediastinal contours are within normal limits. No focal consolidation. No pulmonary edema. No pleural effusion. No pneumothorax. No acute osseous abnormality. Metallic density overlying the right breast may represent jewelry. IMPRESSION: No active disease. Electronically Signed   By: Morgane  Naveau M.D.   On: 05/15/2024 21:18     Procedures   Medications Ordered in the ED  ipratropium-albuterol  (DUONEB) 0.5-2.5 (3) MG/3ML nebulizer solution 3 mL (3 mLs Nebulization Given 05/15/24 2146)  methylPREDNISolone  sodium succinate (SOLU-MEDROL ) 125 mg/2 mL injection 80 mg (80 mg Intramuscular Given 05/15/24 2146)  nitrofurantoin  (macrocrystal-monohydrate) (MACROBID ) capsule 100 mg (100 mg Oral Given 05/15/24 2157)                                    Medical Decision Making Risk Prescription drug management.   23 year old female with asthma exacerbation.   She ran out of her albuterol  inhaler and had been having some wheezing and mild cough.  She is afebrile, normal O2 sats but having some moderate tightness and wheezing on pulmonary exam.  She is given 1 DuoNeb treatment and Solu-Medrol  and wheezing and tightness completely resolved.  Her chest x-ray is negative for any acute cardiopulmonary disease.  Urinalysis did show signs of UTI.  Previous 2 cultures showed sensitivity Macrobid .  Will place on Macrobid  for 5 days.  She has no signs of pyelonephritis.  She is educated on signs  and symptoms return to the ER for. Final diagnoses:  Wheezing  Mild intermittent asthma with exacerbation  Acute cystitis with hematuria    ED Discharge Orders          Ordered    albuterol  (VENTOLIN  HFA) 108 (90 Base) MCG/ACT inhaler  Every 6 hours PRN        05/15/24 2153    predniSONE  (DELTASONE ) 20 MG tablet  Daily with breakfast        05/15/24 2153    nitrofurantoin , macrocrystal-monohydrate, (MACROBID ) 100 MG capsule  2 times daily        05/15/24 2153               Coralyn Derry, PA-C 05/15/24 2228

## 2024-06-02 ENCOUNTER — Ambulatory Visit: Payer: MEDICAID

## 2024-06-11 ENCOUNTER — Encounter: Payer: Self-pay | Admitting: Emergency Medicine

## 2024-06-11 ENCOUNTER — Other Ambulatory Visit: Payer: Self-pay

## 2024-06-11 ENCOUNTER — Emergency Department
Admission: EM | Admit: 2024-06-11 | Discharge: 2024-06-11 | Disposition: A | Discharge status: Home / Self Care | Payer: MEDICAID | Attending: Emergency Medicine | Admitting: Emergency Medicine

## 2024-06-11 DIAGNOSIS — R300 Dysuria: Secondary | ICD-10-CM | POA: Diagnosis present

## 2024-06-11 DIAGNOSIS — N3000 Acute cystitis without hematuria: Secondary | ICD-10-CM | POA: Insufficient documentation

## 2024-06-11 LAB — URINALYSIS, ROUTINE W REFLEX MICROSCOPIC
Bilirubin Urine: NEGATIVE
Glucose, UA: NEGATIVE mg/dL
Hgb urine dipstick: NEGATIVE
Ketones, ur: NEGATIVE mg/dL
Nitrite: POSITIVE — AB
Protein, ur: NEGATIVE mg/dL
Specific Gravity, Urine: 1.019 (ref 1.005–1.030)
WBC, UA: >50 WBC/hpf (ref 0–5)
pH: 5 (ref 5.0–8.0)

## 2024-06-11 LAB — POC URINE PREG, ED: Preg Test, Ur: NEGATIVE

## 2024-06-11 MED ORDER — NITROFURANTOIN MONOHYD MACRO 100 MG PO CAPS: ORAL_CAPSULE | ORAL | 1 refills | Status: DC

## 2024-06-11 MED ORDER — CEPHALEXIN 500 MG PO CAPS
500.0000 mg | ORAL_CAPSULE | Freq: Three times a day (TID) | ORAL | 0 refills | Status: AC
Start: 2024-06-11 — End: 2024-06-18

## 2024-06-11 NOTE — ED Notes (Signed)
 Pt has a history of UTI's and feels the sensation that she possibly has another one. Pt noticed the pain with urination yesterday. PT has no complaints of pain at this time, but has pain with urination 7/10, that tend to linger after urination.

## 2024-06-11 NOTE — ED Triage Notes (Addendum)
 Patient to ED via POV for possible UTI. PT reports burning with urination/ frequency x2 days. States she had lower back pain yesterday. PT reports taking AZO PTA.

## 2024-06-11 NOTE — Discharge Instructions (Signed)
 Take the Keflex  as prescribed.  When you are done without medication, you may start taking the Macrobid  1 pill after sexual activity and then take another pill in 12 hours following sexual activity.  If you continue to get UTIs with that regimen I would consider having the IUD removed or see urology.

## 2024-06-11 NOTE — ED Provider Notes (Signed)
 [facility] Provider Note    Event Date/Time   First MD Initiated Contact with Patient 06/11/24 [phone]     (approximate)   History   Urinary Frequency   HPI  Priscilla Cummings is a 23 y.o. female with frequent UTIs presents emergency department complaining of UTI symptoms.  Patient is only her 5 of the IUD.  States over the past year she has had multiple UTIs.  Feels that it may be related to the IUD.  Patient states it is worse after having sexual activity.  No fever or chills.  No vomiting.  Just has dysuria and hesitancy.      Physical Exam   Triage Vital Signs: ED Triage Vitals  Encounter Vitals Group     BP 06/11/24 0738 112/78     Girls Systolic BP Percentile --      Girls Diastolic BP Percentile --      Boys Systolic BP Percentile --      Boys Diastolic BP Percentile --      Pulse Rate 06/11/24 0738 93     Resp 06/11/24 0738 17     Temp 06/11/24 0738 98.2 F (36.8 C)     Temp Source 06/11/24 0738 Oral     SpO2 06/11/24 0738 100 %     Weight 06/11/24 0736 118 lb (53.5 kg)     Height 06/11/24 0736 5' 3 (1.6 m)     Head Circumference --      Peak Flow --      Pain Score 06/11/24 0736 0     Pain Loc --      Pain Education --      Exclude from Growth Chart --     Most recent vital signs: Vitals:   06/11/24 0738  BP: 112/78  Pulse: 93  Resp: 17  Temp: 98.2 F (36.8 C)  SpO2: 100%     General: Awake, no distress.   CV:  Good peripheral perfusion.  Resp:  Normal effort.  Abd:  No distention.  Nontender Other:      ED Results / Procedures / Treatments   Labs (all labs ordered are listed, but only abnormal results are displayed) Labs Reviewed  URINALYSIS, ROUTINE W REFLEX MICROSCOPIC - Abnormal; Notable for the following components:      Result Value   Color, Urine AMBER (*)    APPearance HAZY (*)    Nitrite POSITIVE (*)    Leukocytes,Ua SMALL (*)    Bacteria, UA RARE (*)    All other components within normal limits   POC URINE PREG, ED     EKG     RADIOLOGY     PROCEDURES:   Procedures  Critical Care:  no Chief Complaint  Patient presents with   Urinary Frequency      MEDICATIONS ORDERED IN ED: Medications - No data to display   IMPRESSION / MDM / ASSESSMENT AND PLAN / ED COURSE  I reviewed the triage vital signs and the nursing notes.                              Differential diagnosis includes, but is not limited to, UTI, cystitis, urethritis, pyelonephritis  Patient's presentation is most consistent with acute illness / injury with system symptoms.   POC pregnancy negative, UA positive for leuks and nitrites  Do not feel patient has pyelonephritis, she has not been febrile vomiting etc.  Feel  this is more of what we call a honeymoon or cystitis.  Will go ahead and give her a treatment for 1 week of antibiotics and then she should take 1 Macrobid  following sexual activity and repeated in 12 hours.  If this is not helping her UTIs then I would recommend she see urology and consider having the IUD removed.  She is already performing basic care to prevent UTIs.  Follow-up with your regular doctor as needed.  Return if worsening.  She is in agreement treatment plan.  Discharged stable condition.      FINAL CLINICAL IMPRESSION(S) / ED DIAGNOSES   Final diagnoses:  Acute cystitis without hematuria     Rx / DC Orders   ED Discharge Orders          Ordered    cephALEXin  (KEFLEX ) 500 MG capsule  3 times daily        06/11/24 0822    nitrofurantoin , macrocrystal-monohydrate, (MACROBID ) 100 MG capsule        06/11/24 9177             Note:  This document was prepared using Dragon voice recognition software and may include unintentional dictation errors.    Gasper Devere ORN, PA-C 06/11/24 9173    Waymond Lorelle Cummins, MD 06/11/24 [phone]

## 2024-10-10 ENCOUNTER — Emergency Department
Admission: EM | Admit: 2024-10-10 | Discharge: 2024-10-10 | Disposition: A | Discharge status: Home / Self Care | Payer: MEDICAID | Attending: Emergency Medicine | Admitting: Emergency Medicine

## 2024-10-10 ENCOUNTER — Other Ambulatory Visit: Payer: Self-pay

## 2024-10-10 DIAGNOSIS — R110 Nausea: Secondary | ICD-10-CM | POA: Diagnosis not present

## 2024-10-10 DIAGNOSIS — N760 Acute vaginitis: Secondary | ICD-10-CM | POA: Diagnosis not present

## 2024-10-10 DIAGNOSIS — B9689 Other specified bacterial agents as the cause of diseases classified elsewhere: Secondary | ICD-10-CM | POA: Insufficient documentation

## 2024-10-10 DIAGNOSIS — J45909 Unspecified asthma, uncomplicated: Secondary | ICD-10-CM | POA: Insufficient documentation

## 2024-10-10 DIAGNOSIS — L292 Pruritus vulvae: Secondary | ICD-10-CM | POA: Diagnosis present

## 2024-10-10 LAB — URINALYSIS, ROUTINE W REFLEX MICROSCOPIC
Bacteria, UA: NONE SEEN
Bilirubin Urine: NEGATIVE
Glucose, UA: NEGATIVE mg/dL
Ketones, ur: NEGATIVE mg/dL
Leukocytes,Ua: NEGATIVE
Nitrite: NEGATIVE
Protein, ur: NEGATIVE mg/dL
Specific Gravity, Urine: 1.02 (ref 1.005–1.030)
pH: 5 (ref 5.0–8.0)

## 2024-10-10 LAB — CHLAMYDIA/NGC RT PCR (ARMC ONLY)
Chlamydia Tr: NOT DETECTED
N gonorrhoeae: NOT DETECTED

## 2024-10-10 LAB — WET PREP, GENITAL
Sperm: NONE SEEN
Trich, Wet Prep: NONE SEEN
WBC, Wet Prep HPF POC: <10 (ref <10)
Yeast Wet Prep HPF POC: NONE SEEN

## 2024-10-10 LAB — POC URINE PREG, ED: Preg Test, Ur: NEGATIVE

## 2024-10-10 MED ORDER — METRONIDAZOLE 500 MG PO TABS: 500.0000 mg | ORAL_TABLET | Freq: Two times a day (BID) | ORAL | 0 refills | Status: DC

## 2024-10-10 MED ORDER — METRONIDAZOLE 500 MG PO TABS: 500.0000 mg | ORAL_TABLET | Freq: Two times a day (BID) | ORAL | 0 refills | Status: AC

## 2024-10-10 NOTE — ED Notes (Signed)
 Patient states she is on the end of period and just has spotting. Patient states she has a pink discharge most of the time that has an odor. Patient also c/o feeling nauseous and has had more frequent bowel movements. Patient states she had more cramping this period.

## 2024-10-10 NOTE — ED Provider Notes (Signed)
 [facility] Provider Note    Event Date/Time   First MD Initiated Contact with Patient 10/10/24 1847     (approximate)   History   Vaginal Itching    HPI  Priscilla Cummings is a 23 y.o. female    with a past medical history of acute cystitis, BV, asthma, pyelonephritis, anaphylactic shock, chlamydia,who presents to the ED complaining of vaginal itching and odor. According to the patient, symptoms started 3 days ago with lower abdominal pain, nauseas, pink vaginal discharge, malodorous.  Patient reported having reduced and she applied her eczema cream with resolution of the pruritus.  Last menstrual period 10/03/2024.  Patient is still having her menstrual period.  Cycles are 30 x 7.  Patient has IUD.  Patient denies chills, fever, cough, diarrhea, or urinary symptoms.  Patient desires to be tested for STDs and HIV.    Patient Active Problem List   Diagnosis Date Noted   Pyelonephritis dx'd 12/14/21 ER 12/23/2021   Anaphylactic shock 05/31/2021   Allergic reaction    Asthma 10/29/2020   AR (allergic rhinitis) 10/07/2012    Physical Exam   Triage Vital Signs: ED Triage Vitals  Encounter Vitals Group     BP 10/10/24 1751 120/76     Girls Systolic BP Percentile --      Girls Diastolic BP Percentile --      Boys Systolic BP Percentile --      Boys Diastolic BP Percentile --      Pulse Rate 10/10/24 1751 80     Resp 10/10/24 1751 18     Temp 10/10/24 1751 98.4 F (36.9 C)     Temp Source 10/10/24 1751 Oral     SpO2 10/10/24 1751 100 %     Weight --      Height --      Head Circumference --      Peak Flow --      Pain Score 10/10/24 1749 0     Pain Loc --      Pain Education --      Exclude from Growth Chart --     Most recent vital signs: Vitals:   10/10/24 1751  BP: 120/76  Pulse: 80  Resp: 18  Temp: 98.4 F (36.9 C)  SpO2: 100%     Physical Exam Vitals and nursing note reviewed.  During triage vital signs were normal.  General:           Awake, no distress.  CV:                  Good peripheral perfusion.  Resp:               Normal effort. no tachypnea Abd:               Bowel sounds positive, skin is intact, no ecchymosis no hematomas or scars.  Mild distention.  Soft nontender to palpation.  Negative McBurney point, negative Rovsing, negative rebound. Other: I did do a pelvic exam with a nurse Jeanna as a chaperone. GU: Under inspection external genitalia does not have signs of irritation, redness, edema or other irregularities.  Bimanual pelvic exam, negative chandelier sign,.  There is presence of pink discharge.  Erythema around the cervix.  I did get samples for chlamydia, gonorrhea, and a wet prep.         ED Results / Procedures / Treatments   Labs (all labs ordered are listed, but only abnormal  results are displayed) Labs Reviewed  WET PREP, GENITAL - Abnormal; Notable for the following components:      Result Value   Clue Cells Wet Prep HPF POC PRESENT (*)    All other components within normal limits  URINALYSIS, ROUTINE W REFLEX MICROSCOPIC - Abnormal; Notable for the following components:   Color, Urine YELLOW (*)    APPearance HAZY (*)    Hgb urine dipstick MODERATE (*)    All other components within normal limits  CHLAMYDIA/NGC RT PCR ([facility] ONLY)            MISC [facility] TEST (SEND OUT)  POC URINE PREG, ED    PROCEDURES:  Critical Care performed:   Procedures   MEDICATIONS ORDERED IN ED: Medications - No data to display Clinical Course as of 10/10/24 2157  [facility] Oct 10, 2024  1958 Urinalysis, Routine w reflex microscopic -Urine, Clean Catch(!) Negative for UTI [AE]  1958 POC Urine Pregnancy, ED Negative for pregnancy [AE]  2017 Wet prep, genital(!) Clue cells positive [AE]  2132 Updated patient with results of wet prep.  Patient needs to take metronidazole  every 12 hours for 7 days.  Advised patient to avoid drinking alcohol while taking metronidazole .  Patient is agreeable with the plan.   Waiting for results of chlamydia and gonorrhea [AE]  2153 Chlamydia/NGC rt PCR ([facility] only) Negative for chlamydia or gonorrhea [AE]    Clinical Course User Index [AE] Janit Kast, PA-C    IMPRESSION / MDM / ASSESSMENT AND PLAN / ED COURSE  I reviewed the triage vital signs and the nursing notes.  Differential diagnosis includes, but is not limited to, chlamydia, gonorrhea, BV, unlikely PID.  Patient's presentation is most consistent with acute complicated illness / injury requiring diagnostic workup.   Priscilla Cummings is a 23 y.o., female presents today with history of 3 days of lower abdominal pain, nauseous, pink vaginal discharge malodorous, that initially was producing pruritus.  On physical exam vital signs are normal, patient is afebrile.  Abdomen sounds positive, skin is intact, mild distended, soft, nontender to palpation.  Physical exam is normal. Plan Chlamydia, gonorrhea, wet prep, HIV, UA Reassess  Patient's diagnosis is consistent with Gardnerella Vaginalis (bacterial vaginosis).Labs are  reassuring confirming Gardnerella vaginalis ruling out chlamydia and gonorrhea.  HIV lab is pending.  I did advise patient to check on MyChart.. I did review the patient's allergies and medications.The patient is in stable and satisfactory condition for discharge home  Patient will be discharged home with prescriptions for metronidazole .  I did advise patient not to have alcohol while taking metronidazole .  Patient is to follow up with PCP as needed or otherwise directed. Patient is given ED precautions to return to the ED for any worsening or new symptoms. Discussed plan of care with patient, answered all of patient's questions, Patient agreeable to plan of care. Advised patient to take medications according to the instructions on the label. Discussed possible side effects of new medications. Patient verbalized understanding.  FINAL CLINICAL IMPRESSION(S) / ED DIAGNOSES   Final  diagnoses:  BV (bacterial vaginosis)     Rx / DC Orders   ED Discharge Orders          Ordered    metroNIDAZOLE  (FLAGYL ) 500 MG tablet  2 times daily,   Status:  Discontinued        10/10/24 2020    metroNIDAZOLE  (FLAGYL ) 500 MG tablet  2 times daily        10/10/24 2133  Note:  This document was prepared using Dragon voice recognition software and may include unintentional dictation errors.   Janit Kast, PA-C 10/10/24 2157    Jacolyn Pae, MD 10/11/24 [phone]

## 2024-10-10 NOTE — ED Triage Notes (Signed)
 Patient states she is having vaginal itching and odor. States she is on her period and her cramps are worse than normal.

## 2024-10-10 NOTE — Discharge Instructions (Addendum)
 You have been diagnosed with bacterial vaginosis.  Please take metronidazole  1 tablet by mouth every 12 hours after main meals for 7 days.  Please avoid taking alcohol while taking metronidazole .  Please drink plenty of fluids.  Please check on MyChart for results of HIV test.  Please come back to ED or go to your PCP if you have new symptoms symptoms worsen.

## 2024-10-14 LAB — MISC LABCORP TEST (SEND OUT): Labcorp test code: 83935

## 2024-12-15 ENCOUNTER — Other Ambulatory Visit: Payer: Self-pay

## 2024-12-15 ENCOUNTER — Encounter: Payer: Self-pay | Admitting: *Deleted

## 2024-12-15 ENCOUNTER — Emergency Department
Admission: EM | Admit: 2024-12-15 | Discharge: 2024-12-15 | Disposition: A | Discharge status: Home / Self Care | Payer: MEDICAID | Attending: Emergency Medicine | Admitting: Emergency Medicine

## 2024-12-15 DIAGNOSIS — N898 Other specified noninflammatory disorders of vagina: Secondary | ICD-10-CM | POA: Diagnosis present

## 2024-12-15 LAB — URINALYSIS, ROUTINE W REFLEX MICROSCOPIC
Bilirubin Urine: NEGATIVE
Glucose, UA: NEGATIVE mg/dL
Ketones, ur: NEGATIVE mg/dL
Leukocytes,Ua: NEGATIVE
Nitrite: NEGATIVE
Protein, ur: NEGATIVE mg/dL
Specific Gravity, Urine: 1.013 (ref 1.005–1.030)
pH: 6 (ref 5.0–8.0)

## 2024-12-15 LAB — CHLAMYDIA/NGC RT PCR (ARMC ONLY)
Chlamydia Tr: NOT DETECTED
N gonorrhoeae: NOT DETECTED

## 2024-12-15 LAB — WET PREP, GENITAL
Clue Cells Wet Prep HPF POC: NONE SEEN
Sperm: NONE SEEN
Trich, Wet Prep: NONE SEEN
WBC, Wet Prep HPF POC: <10
Yeast Wet Prep HPF POC: NONE SEEN

## 2024-12-15 LAB — HIV ANTIBODY (ROUTINE TESTING W REFLEX): HIV Screen 4th Generation wRfx: NONREACTIVE

## 2024-12-15 LAB — POC URINE PREG, ED: Preg Test, Ur: NEGATIVE

## 2024-12-15 MED ORDER — CEFTRIAXONE SODIUM 1 G IJ SOLR
500.0000 mg | Freq: Once | INTRAMUSCULAR | Status: AC
  Administered 2024-12-15: 500 mg via INTRAMUSCULAR
  Filled 2024-12-15: qty 10

## 2024-12-15 MED ORDER — LIDOCAINE HCL (PF) 1 % IJ SOLN
5.0000 mL | Freq: Once | INTRAMUSCULAR | Status: AC
  Administered 2024-12-15: 5 mL
  Filled 2024-12-15: qty 5

## 2024-12-15 MED ORDER — DOXYCYCLINE MONOHYDRATE 100 MG PO TABS: 100.0000 mg | ORAL_TABLET | Freq: Two times a day (BID) | ORAL | 0 refills | Status: AC

## 2024-12-15 NOTE — ED Provider Notes (Signed)
 "  [facility] Provider Note    Event Date/Time   First MD Initiated Contact with Patient 12/15/24 1626     (approximate)   History   Vaginal Discharge   HPI Priscilla Cummings is a 24 y.o. female presenting to the emergency department with intermittent vaginal odor that started last Tuesday (6 days ago).  She also reports she has had some abnormal white discharge on and off since then as well. She has been urinating more often, but is unsure if it is because she is drinking more water at home. She denies nausea, vomiting, diarrhea, abdominal or back pain, fever, chills, vaginal rash, abnormal vaginal bleeding, dysuria, increased urinary urgency. She reports a sexual encounter with a new female sexual partner 1 week ago on Monday 1/12.  She is unsure if they used protective measures. Attempted to go to PCPs office today but they were closed.   LMP over 1 month ago but she endorses some spotting and brown discharge 1-2 weeks ago that is different from her regular menses; denies any spotting since then. She reports she has a Mirena  IUD that has been in place for at least 6 to 7 years and reports recurrent UTIs and BV after insertion.  Physical Exam   Triage Vital Signs: ED Triage Vitals [12/15/24 1615]  Encounter Vitals Group     BP 112/74     Girls Systolic BP Percentile      Girls Diastolic BP Percentile      Boys Systolic BP Percentile      Boys Diastolic BP Percentile      Pulse Rate 76     Resp 18     Temp 98.1 F (36.7 C)     Temp Source Oral     SpO2 97 %     Weight 129 lb (58.5 kg)     Height 5' 3 (1.6 m)     Head Circumference      Peak Flow      Pain Score 0     Pain Loc      Pain Education      Exclude from Growth Chart     Most recent vital signs: Vitals:   12/15/24 1615  BP: 112/74  Pulse: 76  Resp: 18  Temp: 98.1 F (36.7 C)  SpO2: 97%    General: Awake, no distress.  CV:  Good peripheral perfusion. RRR. Resp:  Normal effort.   Abd:  No distention. No TTP. Other:  Pelvic exam denied. No CVA tenderness or midline lumbar tenderness. No pelvic tenderness.  ED Results / Procedures / Treatments   Labs (all labs ordered are listed, but only abnormal results are displayed) Labs Reviewed  URINALYSIS, ROUTINE W REFLEX MICROSCOPIC - Abnormal; Notable for the following components:      Result Value   Color, Urine STRAW (*)    APPearance CLEAR (*)    Hgb urine dipstick SMALL (*)    Bacteria, UA RARE (*)    All other components within normal limits  WET PREP, GENITAL  CHLAMYDIA/NGC RT PCR ([facility] ONLY)            HSV 2 ANTIBODY, IGG  SYPHILIS: RPR W/REFLEX TO RPR TITER AND TREPONEMAL ANTIBODIES, TRADITIONAL SCREENING AND DIAGNOSIS ALGORITHM  HIV ANTIBODY (ROUTINE TESTING W REFLEX)  POC URINE PREG, ED     EKG     RADIOLOGY   PROCEDURES:  Critical Care performed: No  Procedures   MEDICATIONS ORDERED IN ED: Medications  cefTRIAXone  (ROCEPHIN ) injection 500 mg (500 mg Intramuscular Given 12/15/24 1853)  lidocaine  (PF) (XYLOCAINE ) 1 % injection 5 mL (5 mLs Other Given 12/15/24 1853)     IMPRESSION / MDM / ASSESSMENT AND PLAN / ED COURSE  I reviewed the triage vital signs and the nursing notes.                              Differential diagnosis includes, but is not limited to, STI, UTI, vaginitis, BV  Patient's presentation is most consistent with acute complicated illness / injury requiring diagnostic workup.   Clinical Course as of 12/15/24 RENARD Kitchens Dec 15, 2024  1712 Patient is here with intermittent vaginal discharge and odor with recent new possibly unprotected sexual encounter. Afebrile, well appearing, no back pain or tachycardia. No abnormal vaginal bleeding. Pelvic exam denied. UA, G/C, Wet prep ordered in triage.   UA w/ small Hgb, rare bacteria; no nitrites or leukocytes, no WBCs to suggest UTI. Wet prep without any acute finding. C/G pending. UPT negative. Patient requesting STI blood  tests as well. [SD]  1817 Gonorrhea and chlamydia testing negative. Patient with concern for possible false negative results and would like empiric treatment for gonorrhea and chlamydia. Provided IM Rocephin  and sent Rx for doxycycline . Patient will f/u with PCP on RPR, HSV 2 and HIV results.  [SD]    Clinical Course User Index [SD] Sheron Salm, PA-C   The patient may return to the emergency department for any new, worsening, or concerning symptoms. Patient was given the opportunity to ask questions; all questions were answered. Emergency department return precautions were discussed with the patient.  Patient is in agreement to the treatment plan.  Patient is stable for discharge.   FINAL CLINICAL IMPRESSION(S) / ED DIAGNOSES   Final diagnoses:  Vaginal discharge     Rx / DC Orders   ED Discharge Orders          Ordered    doxycycline  (ADOXA) 100 MG tablet  2 times daily        12/15/24 1848             Note:  This document was prepared using Dragon voice recognition software and may include unintentional dictation errors.    Sheron Stella, PA-C 12/15/24 1919    Suzanne Kirsch, MD 12/15/24 2345  "

## 2024-12-15 NOTE — ED Triage Notes (Addendum)
 Pt ambulatory to triage.  Pt has vaginal odor and discharge.  No itching.   No abd pain  No urinary sx.  Pt alert.  No vag bleeding.  Sx for 2 days  pt alert.

## 2024-12-15 NOTE — Discharge Instructions (Addendum)
 You have been seen today in the Emergency Department (ED) for vaginal discharge.  Your workup today shows that you possibly have one or more sexually transmitted infections (STIs) with a potentially false negative swab.  You have been treated with a one time dose for 1 condition and I will send a oral prescription for the other condition.  Please have your partner(s) tested for STD's and do not resume sexual activity until you and your partner have confirmed negative test results or have both been treated. Eat something while taking the antibiotics.  Please follow up with your doctor as soon as possible regarding todays ED visit and your symptoms and for results on your blood work..   Return to the ED if your pain worsens, you develop a fever, or for any other symptoms that concern you.

## 2024-12-15 NOTE — ED Provider Notes (Signed)
 Shared visit   Presents to the emergency department with vaginal discharge and new sexual encounter.  No signs of bacterial vaginosis.  HIV and RPR testing are pending and she will follow-up with her primary care physician.  GC and Chlamydia testing are negative.  No signs of urinary tract infection.  Patient states that she is concerned and could have a false negative test and wants empiric treatment.  Given IM Rocephin  and started on doxycycline .  Discussed follow-up with health department or primary care for repeat testing.  No questions or concerns at time of discharge.   Suzanne Kirsch, MD 12/15/24 [phone]

## 2024-12-16 ENCOUNTER — Telehealth: Payer: Self-pay | Admitting: Emergency Medicine

## 2024-12-16 LAB — SYPHILIS: RPR W/REFLEX TO RPR TITER AND TREPONEMAL ANTIBODIES, TRADITIONAL SCREENING AND DIAGNOSIS ALGORITHM: RPR Ser Ql: NONREACTIVE

## 2024-12-16 MED ORDER — DOXYCYCLINE HYCLATE 100 MG PO CAPS: 100.0000 mg | ORAL_CAPSULE | Freq: Two times a day (BID) | ORAL | 0 refills | Status: DC

## 2024-12-16 NOTE — Telephone Encounter (Signed)
 Patient's insurance would not cover the tablets I have changed the prescription to capsules of doxycycline .

## 2024-12-17 LAB — HSV 2 ANTIBODY, IGG: HSV 2 Glycoprotein G Ab, IgG: NONREACTIVE

## 2024-12-26 ENCOUNTER — Emergency Department
Admission: EM | Admit: 2024-12-26 | Discharge: 2024-12-26 | Disposition: A | Discharge status: Home / Self Care | Payer: MEDICAID | Attending: Emergency Medicine | Admitting: Emergency Medicine

## 2024-12-26 DIAGNOSIS — J45909 Unspecified asthma, uncomplicated: Secondary | ICD-10-CM | POA: Insufficient documentation

## 2024-12-26 DIAGNOSIS — N898 Other specified noninflammatory disorders of vagina: Secondary | ICD-10-CM | POA: Diagnosis present

## 2024-12-26 DIAGNOSIS — N760 Acute vaginitis: Secondary | ICD-10-CM | POA: Diagnosis not present

## 2024-12-26 DIAGNOSIS — B9689 Other specified bacterial agents as the cause of diseases classified elsewhere: Secondary | ICD-10-CM | POA: Diagnosis not present

## 2024-12-26 LAB — URINALYSIS, ROUTINE W REFLEX MICROSCOPIC
Bilirubin Urine: NEGATIVE
Glucose, UA: NEGATIVE mg/dL
Hgb urine dipstick: NEGATIVE
Ketones, ur: 20 mg/dL — AB
Leukocytes,Ua: NEGATIVE
Nitrite: NEGATIVE
Protein, ur: NEGATIVE mg/dL
Specific Gravity, Urine: 1.015 (ref 1.005–1.030)
pH: 7 (ref 5.0–8.0)

## 2024-12-26 LAB — POC URINE PREG, ED: Preg Test, Ur: NEGATIVE

## 2024-12-26 LAB — WET PREP, GENITAL
Sperm: NONE SEEN
Trich, Wet Prep: NONE SEEN
WBC, Wet Prep HPF POC: >=10 — AB
Yeast Wet Prep HPF POC: NONE SEEN

## 2024-12-26 MED ORDER — METRONIDAZOLE 500 MG PO TABS: 500.0000 mg | ORAL_TABLET | Freq: Two times a day (BID) | ORAL | 0 refills | Status: AC

## 2024-12-26 MED ORDER — METRONIDAZOLE 500 MG PO TABS
500.0000 mg | ORAL_TABLET | Freq: Once | ORAL | Status: AC
  Administered 2024-12-26: 500 mg via ORAL
  Filled 2024-12-26: qty 1

## 2024-12-26 NOTE — ED Provider Notes (Signed)
 "  [facility] Provider Note    Event Date/Time   First MD Initiated Contact with Patient 12/26/24 2121     (approximate)   History   Vaginal Discharge   HPI  Priscilla Cummings is a 24 y.o. female  with a past medical history of asthma, eczema, allergic rhinitis presents to the emergency department with abnormal vaginal discharge and fishy odor that started a few days ago that has been present on and off for the past 2 weeks.  She reports some occasional pelvic tightness at times but endorses no pain or cramping at this time.  She had 1 episode of bleeding after sexual intercourse in the last week but otherwise denies any abnormal vaginal bleeding. LMP within the last 4-6 weeks. Reports she was originally taking some prophylactic Macrobid  for sexual intercourse due to having continuous UTIs after sexual intercourse and is requesting to get this medication refilled today. Has IUD in place. Has one sexual partner, female. Denies fever, back pain, abdominal pain, vomiting, urinary symptoms.  Has family medicine appt scheduled on 12/31/2024. I reviewed the medical chart, seen this patient on 12-15-2024 for similar concern without any acute findings.  Treated for gonorrhea and chlamydia empirically  Physical Exam   Triage Vital Signs: ED Triage Vitals [12/26/24 2007]  Encounter Vitals Group     BP (!) 135/93     Girls Systolic BP Percentile      Girls Diastolic BP Percentile      Boys Systolic BP Percentile      Boys Diastolic BP Percentile      Pulse Rate 78     Resp 18     Temp 98.1 F (36.7 C)     Temp Source Oral     SpO2 97 %     Weight      Height      Head Circumference      Peak Flow      Pain Score      Pain Loc      Pain Education      Exclude from Growth Chart     Most recent vital signs: Vitals:   12/26/24 2007  BP: (!) 135/93  Pulse: 78  Resp: 18  Temp: 98.1 F (36.7 C)  SpO2: 97%    General: Awake, in no acute distress. Appears  stated age. CV: Good peripheral perfusion.  Respiratory:Normal respiratory effort.  No respiratory distress.  GI: Soft, non-distended, non-tender.  Skin:Warm, dry, intact.  GU: Normal labia majora and minora.  No rashes.  Pelvic exam with thin white malodorous discharge surrounding the cervix.  Completed pelvic swabs during pelvic exam. No CMT. RN Chaperone present for exam.  ED Results / Procedures / Treatments   Labs (all labs ordered are listed, but only abnormal results are displayed) Labs Reviewed  WET PREP, GENITAL - Abnormal; Notable for the following components:      Result Value   Clue Cells Wet Prep HPF POC PRESENT (*)    WBC, Wet Prep HPF POC >=10 (*)    All other components within normal limits  URINALYSIS, ROUTINE W REFLEX MICROSCOPIC - Abnormal; Notable for the following components:   Color, Urine YELLOW (*)    APPearance HAZY (*)    Ketones, ur 20 (*)    All other components within normal limits  CHLAMYDIA/NGC RT PCR ([facility] ONLY)            POC URINE PREG, ED     EKG  RADIOLOGY    PROCEDURES:  Critical Care performed: No   Procedures   MEDICATIONS ORDERED IN ED: Medications  metroNIDAZOLE  (FLAGYL ) tablet 500 mg (500 mg Oral Given 12/26/24 2342)     IMPRESSION / MDM / ASSESSMENT AND PLAN / ED COURSE  I reviewed the triage vital signs and the nursing notes.                              Differential diagnosis includes, but is not limited to, bacterial vaginosis, STD, UTI, dehydration  Patient's presentation is most consistent with acute complicated illness / injury requiring diagnostic workup.  Patient is a 24 year old female presenting with malodorous vaginal discharge.  She is well-appearing on exam, afebrile.  No back pain or abdominal /pelvic pain.  No vomiting.  Requested pelvic exam given she was seen by me on 1/19 with similar symptoms, self swabbed, and did not have any acute findings on wet prep and gonorrhea chlamydia swab.  No CMT. Was  already treated empirically for gonorrhea and chlamydia last visit.  Pelvic exam shows thin white malodorous discharge. UA with ketones, no evidence of infection at this time.  UPT negative.  G/C negative.  Wet prep positive for clue cells indicating BV and will treat with metronidazole .  Discussed no use of alcohol while taking metronidazole . She already has a follow-up appointment scheduled on 2/4 with her PCP.  The patient may return to the emergency department for any new, worsening, or concerning symptoms. Patient was given the opportunity to ask questions; all questions were answered. Emergency department return precautions were discussed with the patient.  Patient is in agreement to the treatment plan.  Patient is stable for discharge.   FINAL CLINICAL IMPRESSION(S) / ED DIAGNOSES   Final diagnoses:  BV (bacterial vaginosis)     Rx / DC Orders   ED Discharge Orders          Ordered    metroNIDAZOLE  (FLAGYL ) 500 MG tablet  2 times daily        12/26/24 2336             Note:  This document was prepared using Dragon voice recognition software and may include unintentional dictation errors.     Sheron [place], [place] 12/27/24 1039  "

## 2024-12-26 NOTE — ED Triage Notes (Signed)
 PT complains of vaginal odor x 2 weeks. Was seen here for the same a few weeks ago and was tested for STDs and was negative for all test and would like to be retested. PT denies any urinary complaints. States that her vaginal discharge has is usually white/clear but for 2 days this week discharge had a yellow tint. Pt is mostly concerned about the odor and was not able to the see her PCP. When pt received call that her STDs were negative she was more concerned and the provider offered to call in a prescription for 7 days and she completed the course but continues to have an odor.

## 2024-12-26 NOTE — Discharge Instructions (Addendum)
 You were seen in the emergency department for bacterial vaginosis.  Please pick up and take medication as prescribed.  Please do not drink alcohol while taking this medication.  Return to the ER for any fever, abdominal or back pain, vomiting, or any new, worsening or concerning symptoms.  Abstain from sexual intercourse until you can follow-up with your primary care provider on 2/4. Check your MyChart on results of gonorrhea and chlamydia swab results. I do not believe we need to treat for these given we treated 10 days ago when you were seen last, but I will call you if they are positive and you need to return to the ER for treatment. Otherwise, follow up with your primary care provider.

## 2024-12-27 LAB — CHLAMYDIA/NGC RT PCR (ARMC ONLY)
Chlamydia Tr: NOT DETECTED
N gonorrhoeae: NOT DETECTED
